# Patient Record
Sex: Male | Born: 1965 | Race: White | Hispanic: No | Smoking: Never smoker
Health system: Southern US, Community
[De-identification: ages and names within clinical notes are randomized; demographics above are authoritative.]

## PROBLEM LIST (undated history)

## (undated) DIAGNOSIS — R12 Heartburn: Secondary | ICD-10-CM

## (undated) DIAGNOSIS — N433 Hydrocele, unspecified: Secondary | ICD-10-CM

## (undated) DIAGNOSIS — Z973 Presence of spectacles and contact lenses: Secondary | ICD-10-CM

## (undated) HISTORY — PX: TYMPANOSTOMY TUBE PLACEMENT: SHX32

## (undated) HISTORY — PX: KNEE ARTHROSCOPY W/ MENISCAL REPAIR: SHX1877

---

## 2000-09-09 ENCOUNTER — Ambulatory Visit (HOSPITAL_COMMUNITY): Admission: RE | Admit: 2000-09-09 | Discharge: 2000-09-09 | Payer: Self-pay | Admitting: General Surgery

## 2000-09-09 HISTORY — PX: INGUINAL HERNIA REPAIR: SUR1180

## 2004-02-02 ENCOUNTER — Emergency Department (HOSPITAL_COMMUNITY): Admission: EM | Admit: 2004-02-02 | Discharge: 2004-02-02 | Payer: Self-pay | Admitting: Emergency Medicine

## 2008-10-27 ENCOUNTER — Emergency Department (HOSPITAL_COMMUNITY): Admission: EM | Admit: 2008-10-27 | Discharge: 2008-10-27 | Payer: Self-pay | Admitting: Emergency Medicine

## 2010-12-06 LAB — COMPREHENSIVE METABOLIC PANEL
ALT: 26 U/L (ref 0–53)
AST: 28 U/L (ref 0–37)
Albumin: 4.6 g/dL (ref 3.5–5.2)
Alkaline Phosphatase: 71 U/L (ref 39–117)
Potassium: 4 mEq/L (ref 3.5–5.1)
Sodium: 137 mEq/L (ref 135–145)
Total Protein: 7.5 g/dL (ref 6.0–8.3)

## 2011-01-11 NOTE — Op Note (Signed)
[place]. [facility]  Patient:    Dennis Cohen, Dennis Cohen                       MRN: [id number] Adm. Date:  [id number] Attending:  Tempie Donning                           Operative Report  PREOPERATIVE DIAGNOSIS:  Left inguinal hernia.  POSTOPERATIVE DIAGNOSIS:  Left inguinal hernia, large indirect.  PROCEDURE:  Repair left inguinal hernia with underlay preperitoneal and onlay patch Prolene mesh.  SURGEON:  Gita Kudo, M.D.  ANESTHESIA:  General.  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  OPERATIVE FINDINGS:  The patient had a large left inguinal hernia that was indirect.  He had a lipoma of the cord.  Although the floor was examined and was weak, he did not have a true direct hernia.  The nerves and cord structures were identified and not injured.  DESCRIPTION OF PROCEDURE:  Under satisfactory general endotracheal anesthesia, having received 1 g Ancef preoperatively, the patients abdomen and genitalia were prepped and draped in the standard fashion.  A slightly oblique left lower abdominal incision was made and carried down to and through the external oblique and external ring.  Bleeders were coagulated and/or tied with 3-0 Vicryl.  The cord and the contents were mobilized with the Penrose drain and the lipoma dissected high, ligated with 3-0 Vicryl, and excised.  Likewise, the indirect hernia sac was identified, dissected high, and contents easily reduced.  It was twisted and secured at the neck with 0 Prolene suture ligature and left long after cutting and the sac excised.  The floor of the canal was then opened from the pubis to the internal ring and finger dissection used to develop the preperitoneal space to allow placement of the mesh underlay.  The contents were held away with a gauze, and a piece of 4 x 6 inch Prolene mesh was cut into one-third, two-third, and the  smaller increment over and anchored to the preperitoneal space with a suture in the Coopers ligament.  It was then unfolded inferiorly and laterally and then the gauze removed from the preperitoneal space and the mesh unfolded medially and superiorly.  It lay in good position.  The floor was then closed over this with a running suture of 0 Prolene suture, taking intermittent bites of the mesh to hold it in good position.  At the internal ring, the suture was tied and the ends of the suture left long.  This ring was made snug.  The remainder of the mesh was then tailored into an oval to sit in the floor and a slit made to go around the cord structures.  It was anchored at the internal ring with the previous suture and then tacked around the periphery under slight tension to the internal oblique above and the inguinal ligament below.  The tails were sutured to each other above and lateral to the cord and anchored to the fascia there also.  Then the wound was lavaged with saline and infiltrated with Marcaine.  It was closed in layers with running 2-0 Vicryl for external oblique, interrupted 2-0 Vicryl for deep fascia, 3-0 Vicryl for subcutaneous tissue, Steri-Strips approximated the skin.  Sterile absorbent dressing applied, and the patient went to the recovery room from the operating room without complication. DD:  09/09/00 TD:  09/09/00 Job: [id number]

## 2013-08-13 ENCOUNTER — Telehealth: Payer: Self-pay | Admitting: Radiology

## 2013-08-13 ENCOUNTER — Ambulatory Visit (INDEPENDENT_AMBULATORY_CARE_PROVIDER_SITE_OTHER): Payer: BC Managed Care – PPO | Admitting: Emergency Medicine

## 2013-08-13 VITALS — BP 130/80 | HR 68 | Temp 99.1°F | Resp 16 | Ht 67.0 in | Wt 189.0 lb

## 2013-08-13 DIAGNOSIS — N508 Other specified disorders of male genital organs: Secondary | ICD-10-CM

## 2013-08-13 DIAGNOSIS — N5089 Other specified disorders of the male genital organs: Secondary | ICD-10-CM

## 2013-08-13 DIAGNOSIS — N50812 Left testicular pain: Secondary | ICD-10-CM

## 2013-08-13 DIAGNOSIS — N509 Disorder of male genital organs, unspecified: Secondary | ICD-10-CM

## 2013-08-13 LAB — POCT URINALYSIS DIPSTICK
Bilirubin, UA: NEGATIVE
Blood, UA: NEGATIVE
Nitrite, UA: NEGATIVE
pH, UA: 7.5

## 2013-08-13 LAB — POCT CBC
Hemoglobin: 14.6 g/dL (ref 14.1–18.1)
Lymph, poc: 2 (ref 0.6–3.4)
MCH, POC: 30.4 pg (ref 27–31.2)
MCHC: 32.2 g/dL (ref 31.8–35.4)
MID (cbc): 0.3 (ref 0–0.9)
MPV: 8 fL (ref 0–99.8)
POC MID %: 5.2 %M (ref 0–12)
WBC: 6.1 10*3/uL (ref 4.6–10.2)

## 2013-08-13 NOTE — Telephone Encounter (Signed)
To you FYI  

## 2013-08-13 NOTE — Patient Instructions (Addendum)
You can go for the [place] Monday at 3pm at [facility] [address] [place]

## 2013-08-13 NOTE — Telephone Encounter (Signed)
[place] scheduled for Monday 3 pm

## 2013-08-13 NOTE — Progress Notes (Signed)
   Subjective:    Patient ID: Dennis Cohen, male    DOB: Sep 13, 1965, 47 y.o.   MRN: [id number]  HPI patient states for the last 3-4 weeks and possibly longer he has noticed swelling of his left testicle. He states he felt like he had a recurrence of his hernia. He had surgery for hernia about 10 years ago.    Review of Systems he denies any GI symptoms of nausea vomiting he has had no change in bowel habits .    Objective:   Physical Exam the patient is not acutely ill there is a baseball size mass involving the left testicle which does transilluminate I did not feel a distinct hernia  Results for orders placed in visit on 08/13/13  POCT CBC      Result Value Range   WBC 6.1  4.6 - 10.2 K/uL   Lymph, poc 2.0  0.6 - 3.4   POC LYMPH PERCENT 32.5  10 - 50 %L   MID (cbc) 0.3  0 - 0.9   POC MID % 5.2  0 - 12 %M   POC Granulocyte 3.8  2 - 6.9   Granulocyte percent 62.3  37 - 80 %G   RBC 4.81  4.69 - 6.13 M/uL   Hemoglobin 14.6  14.1 - 18.1 g/dL   HCT, POC 91.4  78.2 - 53.7 %   MCV 94.2  80 - 97 fL   MCH, POC 30.4  27 - 31.2 pg   MCHC 32.2  31.8 - 35.4 g/dL   RDW, POC 95.6     Platelet Count, POC 272  142 - 424 K/uL   MPV 8.0  0 - 99.8 fL  POCT URINALYSIS DIPSTICK      Result Value Range   Color, UA yellow     Clarity, UA clear     Glucose, UA neg     Bilirubin, UA neg     Ketones, UA neg     Spec Grav, UA 1.020     Blood, UA neg     pH, UA 7.5     Protein, UA neg     Urobilinogen, UA 0.2     Nitrite, UA neg     Leukocytes, UA Negative          Assessment & Plan:  We'll go ahead and do a CBC and a urine because he had a temperature of 99 1. I've scheduled him for an ultrasound of the left testicle along with referral to the urologist.

## 2013-08-16 ENCOUNTER — Other Ambulatory Visit: Payer: Self-pay

## 2013-08-17 ENCOUNTER — Ambulatory Visit
Admission: RE | Admit: 2013-08-17 | Discharge: 2013-08-17 | Disposition: A | Discharge status: Home / Self Care | Payer: BC Managed Care – PPO | Source: Ambulatory Visit | Attending: Emergency Medicine | Admitting: Emergency Medicine

## 2013-08-17 DIAGNOSIS — N5089 Other specified disorders of the male genital organs: Secondary | ICD-10-CM

## 2013-08-17 DIAGNOSIS — N50812 Left testicular pain: Secondary | ICD-10-CM

## 2013-09-29 ENCOUNTER — Other Ambulatory Visit: Payer: Self-pay | Admitting: Urology

## 2013-10-01 ENCOUNTER — Encounter (HOSPITAL_BASED_OUTPATIENT_CLINIC_OR_DEPARTMENT_OTHER): Payer: Self-pay | Admitting: *Deleted

## 2013-10-04 ENCOUNTER — Encounter (HOSPITAL_BASED_OUTPATIENT_CLINIC_OR_DEPARTMENT_OTHER): Payer: Self-pay | Admitting: *Deleted

## 2013-10-04 NOTE — Progress Notes (Signed)
NPO AFTER MN.  ARRIVE AT 0830.  NEEDS HG. 

## 2013-10-11 ENCOUNTER — Ambulatory Visit (HOSPITAL_BASED_OUTPATIENT_CLINIC_OR_DEPARTMENT_OTHER): Payer: BC Managed Care – PPO | Admitting: Anesthesiology

## 2013-10-11 ENCOUNTER — Encounter (HOSPITAL_BASED_OUTPATIENT_CLINIC_OR_DEPARTMENT_OTHER): Payer: Self-pay | Admitting: *Deleted

## 2013-10-11 ENCOUNTER — Encounter (HOSPITAL_BASED_OUTPATIENT_CLINIC_OR_DEPARTMENT_OTHER)
Admission: RE | Disposition: A | Discharge status: Home / Self Care | Payer: Self-pay | Source: Ambulatory Visit | Attending: Urology

## 2013-10-11 ENCOUNTER — Ambulatory Visit (HOSPITAL_BASED_OUTPATIENT_CLINIC_OR_DEPARTMENT_OTHER)
Admission: RE | Admit: 2013-10-11 | Discharge: 2013-10-11 | Disposition: A | Discharge status: Home / Self Care | Payer: BC Managed Care – PPO | Source: Ambulatory Visit | Attending: Urology | Admitting: Urology

## 2013-10-11 ENCOUNTER — Encounter (HOSPITAL_BASED_OUTPATIENT_CLINIC_OR_DEPARTMENT_OTHER): Payer: BC Managed Care – PPO | Admitting: Anesthesiology

## 2013-10-11 DIAGNOSIS — N433 Hydrocele, unspecified: Secondary | ICD-10-CM

## 2013-10-11 HISTORY — DX: Presence of spectacles and contact lenses: Z97.3

## 2013-10-11 HISTORY — DX: Hydrocele, unspecified: N43.3

## 2013-10-11 HISTORY — PX: HYDROCELE EXCISION: SHX482

## 2013-10-11 LAB — POCT HEMOGLOBIN-HEMACUE: Hemoglobin: 14.8 g/dL (ref 13.0–17.0)

## 2013-10-11 SURGERY — HYDROCELECTOMY
Anesthesia: General | Site: Scrotum | Laterality: Left

## 2013-10-11 MED ORDER — MIDAZOLAM HCL 2 MG/2ML IJ SOLN
INTRAMUSCULAR | Status: AC
  Filled 2013-10-11: qty 2

## 2013-10-11 MED ORDER — PROPOFOL 10 MG/ML IV BOLUS
INTRAVENOUS | Status: DC | PRN
  Administered 2013-10-11: 200 mg via INTRAVENOUS

## 2013-10-11 MED ORDER — OXYCODONE-ACETAMINOPHEN 10-325 MG PO TABS: 1.0000 | ORAL_TABLET | ORAL | Status: DC | PRN

## 2013-10-11 MED ORDER — DEXAMETHASONE SODIUM PHOSPHATE 4 MG/ML IJ SOLN
INTRAMUSCULAR | Status: DC | PRN
  Administered 2013-10-11: 10 mg via INTRAVENOUS

## 2013-10-11 MED ORDER — ONDANSETRON HCL 4 MG/2ML IJ SOLN
INTRAMUSCULAR | Status: DC | PRN
  Administered 2013-10-11: 4 mg via INTRAVENOUS

## 2013-10-11 MED ORDER — BUPIVACAINE-EPINEPHRINE 0.5% -1:200000 IJ SOLN
INTRAMUSCULAR | Status: DC | PRN
  Administered 2013-10-11: 8 mL

## 2013-10-11 MED ORDER — OXYCODONE-ACETAMINOPHEN 5-325 MG PO TABS
1.0000 | ORAL_TABLET | ORAL | Status: DC | PRN
  Administered 2013-10-11: 1 via ORAL
  Filled 2013-10-11: qty 2

## 2013-10-11 MED ORDER — FENTANYL CITRATE 0.05 MG/ML IJ SOLN
INTRAMUSCULAR | Status: DC | PRN
  Administered 2013-10-11: 50 ug via INTRAVENOUS
  Administered 2013-10-11 (×3): 25 ug via INTRAVENOUS

## 2013-10-11 MED ORDER — FENTANYL CITRATE 0.05 MG/ML IJ SOLN
INTRAMUSCULAR | Status: AC
  Filled 2013-10-11: qty 6

## 2013-10-11 MED ORDER — OXYCODONE-ACETAMINOPHEN 5-325 MG PO TABS
ORAL_TABLET | ORAL | Status: AC
  Filled 2013-10-11: qty 1

## 2013-10-11 MED ORDER — LACTATED RINGERS IV SOLN
INTRAVENOUS | Status: DC
  Administered 2013-10-11: 09:00:00 via INTRAVENOUS
  Filled 2013-10-11: qty 1000

## 2013-10-11 MED ORDER — FENTANYL CITRATE 0.05 MG/ML IJ SOLN
INTRAMUSCULAR | Status: AC
  Filled 2013-10-11: qty 2

## 2013-10-11 MED ORDER — LIDOCAINE HCL (CARDIAC) 20 MG/ML IV SOLN
INTRAVENOUS | Status: DC | PRN
  Administered 2013-10-11: 60 mg via INTRAVENOUS

## 2013-10-11 MED ORDER — FENTANYL CITRATE 0.05 MG/ML IJ SOLN
25.0000 ug | INTRAMUSCULAR | Status: DC | PRN
  Administered 2013-10-11 (×2): 50 ug via INTRAVENOUS
  Filled 2013-10-11: qty 1

## 2013-10-11 MED ORDER — CEFAZOLIN SODIUM-DEXTROSE 2-3 GM-% IV SOLR
2.0000 g | INTRAVENOUS | Status: AC
Start: 2013-10-11 — End: 2013-10-11
  Administered 2013-10-11: 2 g via INTRAVENOUS
  Filled 2013-10-11: qty 50

## 2013-10-11 MED ORDER — LACTATED RINGERS IV SOLN
INTRAVENOUS | Status: DC
  Administered 2013-10-11: 11:00:00 via INTRAVENOUS
  Filled 2013-10-11: qty 1000

## 2013-10-11 MED ORDER — MIDAZOLAM HCL 5 MG/5ML IJ SOLN
INTRAMUSCULAR | Status: DC | PRN
  Administered 2013-10-11: 2 mg via INTRAVENOUS

## 2013-10-11 SURGICAL SUPPLY — 42 items
BLADE SURG 15 STRL LF DISP TIS (BLADE) ×1 IMPLANT
BLADE SURG 15 STRL SS (BLADE) ×1
BLADE SURG ROTATE 9660 (MISCELLANEOUS) IMPLANT
BNDG GAUZE ELAST 4 BULKY (GAUZE/BANDAGES/DRESSINGS) ×2 IMPLANT
CANISTER SUCTION 1200CC (MISCELLANEOUS) IMPLANT
CANISTER SUCTION 2500CC (MISCELLANEOUS) IMPLANT
CLEANER CAUTERY TIP 5X5 PAD (MISCELLANEOUS) IMPLANT
CLOTH BEACON ORANGE TIMEOUT ST (SAFETY) ×2 IMPLANT
COVER MAYO STAND STRL (DRAPES) ×2 IMPLANT
COVER TABLE BACK 60X90 (DRAPES) ×2 IMPLANT
DISSECTOR ROUND CHERRY 3/8 STR (MISCELLANEOUS) IMPLANT
DRAIN PENROSE 18X1/4 LTX STRL (WOUND CARE) IMPLANT
DRAPE PED LAPAROTOMY (DRAPES) ×2 IMPLANT
ELECT REM PT RETURN 9FT ADLT (ELECTROSURGICAL)
ELECTRODE REM PT RTRN 9FT ADLT (ELECTROSURGICAL) IMPLANT
GLOVE BIO SURGEON STRL SZ8 (GLOVE) ×4 IMPLANT
GLOVE BIOGEL PI IND STRL 7.5 (GLOVE) ×2 IMPLANT
GLOVE BIOGEL PI INDICATOR 7.5 (GLOVE) ×2
GLOVE SURG SS PI 7.5 STRL IVOR (GLOVE) ×2 IMPLANT
GOWN PREVENTION PLUS LG XLONG (DISPOSABLE) IMPLANT
GOWN STRL REIN XL XLG (GOWN DISPOSABLE) IMPLANT
GOWN STRL REUS W/TWL XL LVL3 (GOWN DISPOSABLE) ×4 IMPLANT
NEEDLE HYPO 25X1 1.5 SAFETY (NEEDLE) IMPLANT
NS IRRIG 500ML POUR BTL (IV SOLUTION) ×2 IMPLANT
PACK BASIN DAY SURGERY FS (CUSTOM PROCEDURE TRAY) ×2 IMPLANT
PAD CLEANER CAUTERY TIP 5X5 (MISCELLANEOUS)
PENCIL BUTTON HOLSTER BLD 10FT (ELECTRODE) ×2 IMPLANT
SPONGE GAUZE 4X4 12PLY STER LF (GAUZE/BANDAGES/DRESSINGS) ×2 IMPLANT
SUPPORT SCROTAL LG STRP (MISCELLANEOUS) ×2 IMPLANT
SUT CHROMIC 3 0 SH 27 (SUTURE) ×6 IMPLANT
SUT ETHILON 3 0 PS 1 (SUTURE) IMPLANT
SUT SILK 4 0 TIES 17X18 (SUTURE) IMPLANT
SUT VIC AB 4-0 RB1 27 (SUTURE)
SUT VIC AB 4-0 RB1 27X BRD (SUTURE) IMPLANT
SUT VICRYL 6 0 RB 1 (SUTURE) IMPLANT
SYR CONTROL 10ML LL (SYRINGE) IMPLANT
TOWEL OR 17X24 6PK STRL BLUE (TOWEL DISPOSABLE) ×4 IMPLANT
TRAY DSU PREP LF (CUSTOM PROCEDURE TRAY) ×2 IMPLANT
TUBE CONNECTING 12X1/4 (SUCTIONS) ×2 IMPLANT
WATER STERILE IRR 3000ML UROMA (IV SOLUTION) IMPLANT
WATER STERILE IRR 500ML POUR (IV SOLUTION) ×2 IMPLANT
YANKAUER SUCT BULB TIP NO VENT (SUCTIONS) ×2 IMPLANT

## 2013-10-11 NOTE — Discharge Instructions (Signed)
Scrotal surgery postoperative instructions ° °Wound: ° °In most cases your incision will have absorbable sutures that will dissolve within the first 10-20 days. Some will fall out even earlier. Expect some redness as the sutures dissolved but this should occur only around the sutures. If there is generalized redness, especially with increasing pain or swelling, let us know. The scrotum will very likely get "black and blue" as the blood in the tissues spread. Sometimes the whole scrotum will turn colors. The black and blue is followed by a yellow and brown color. In time, all the discoloration will go away. In some cases some firm swelling in the area of the testicle may persist for up to 4-6 weeks after the surgery and is considered normal in most cases. ° °Diet: ° °You may return to your normal diet within 24 hours following your surgery. You may note some mild nausea and possibly vomiting the first 6-8 hours following surgery. This is usually due to the side effects of anesthesia, and will disappear quite soon. I would suggest clear liquids and a very light meal the first evening following your surgery. ° °Activity: ° °Your physical activity should be restricted the first 48 hours. During that time you should remain relatively inactive, moving about only when necessary. During the first 7-10 days following surgery he should avoid lifting any heavy objects (anything greater than 15 pounds), and avoid strenuous exercise. If you work, ask us specifically about your restrictions, both for work and home. We will write a note to your employer if needed. ° °You should plan to wear a tight pair of jockey shorts or an athletic supporter for the first 4-5 days, even to sleep. This will keep the scrotum immobilized to some degree and keep the swelling down. ° °Ice packs should be placed on and off over the scrotum for the first 48 hours. Frozen peas or corn in a ZipLock bag can be frozen, used and re-frozen. Fifteen minutes  on and 15 minutes off is a reasonable schedule. The ice is a good pain reliever and keeps the swelling down. ° °Hygiene: ° °You may shower 48 hours after your surgery. Tub bathing should be restricted until the seventh day. ° ° ° ° ° ° ° ° ° °Medication: ° °You will be sent home with some type of pain medication. In many cases you will be sent home with a narcotic pain pill (Vicodin or Tylox). If the pain is not too bad, you may take either Tylenol (acetaminophen) or Advil (ibuprofen) which contain no narcotic agents, and might be tolerated a little better, with fewer side effects. If the pain medication you are sent home with does not control the pain, you will have to let us know. Some narcotic pain medications cannot be given or refilled by a phone call to a pharmacy. ° °Problems you should report to us: ° °· Fever of 101.0 degrees Fahrenheit or greater. °· Moderate or severe swelling under the skin incision or involving the scrotum. °· Drug reaction such as hives, a rash, nausea or vomiting ° ° °Post Anesthesia Home Care Instructions ° °Activity: °Get plenty of rest for the remainder of the day. A responsible adult should stay with you for 24 hours following the procedure.  °For the next 24 hours, DO NOT: °-Drive a car °-Operate machinery °-Drink alcoholic beverages °-Take any medication unless instructed by your physician °-Make any legal decisions or sign important papers. ° °Meals: °Start with liquid foods such as gelatin or   regular foods as tolerated. Avoid greasy, spicy, heavy foods. If nausea and/or vomiting occur, drink only clear liquids until the nausea and/or vomiting subsides. Call your physician if vomiting continues.  Special Instructions/Symptoms: Your throat may feel dry or sore from the anesthesia or the breathing tube placed in your throat during surgery. If this causes discomfort, gargle with warm salt water. The discomfort should disappear within 24 hours.

## 2013-10-11 NOTE — Anesthesia Preprocedure Evaluation (Addendum)
Anesthesia Evaluation  Patient identified by MRN, date of birth, ID band Patient awake    Reviewed: Allergy & Precautions, H&P , NPO status , Patient's Chart, lab work & pertinent test results  Airway Mallampati: II  TM Distance: >3 FB Neck ROM: full    Dental no notable dental hx. (+) Teeth Intact, Dental Advisory Given   Pulmonary neg pulmonary ROS,  breath sounds clear to auscultation  Pulmonary exam normal       Cardiovascular Exercise Tolerance: Good negative cardio ROS  Rhythm:regular Rate:Normal     Neuro/Psych negative neurological ROS  negative psych ROS   GI/Hepatic negative GI ROS, Neg liver ROS,   Endo/Other  negative endocrine ROS  Renal/GU negative Renal ROS  negative genitourinary   Musculoskeletal   Abdominal   Peds  Hematology negative hematology ROS (+)   Anesthesia Other Findings   Reproductive/Obstetrics negative OB ROS                             Anesthesia Physical Anesthesia Plan  ASA: I  Anesthesia Plan: General   Post-op Pain Management:    Induction: Intravenous  Airway Management Planned: LMA  Additional Equipment:   Intra-op Plan:   Post-operative Plan:   Informed Consent: I have reviewed the patients History and Physical, chart, labs and discussed the procedure including the risks, benefits and alternatives for the proposed anesthesia with the patient or authorized representative who has indicated his/her understanding and acceptance.   Dental Advisory Given  Plan Discussed with: CRNA and Surgeon  Anesthesia Plan Comments:         Anesthesia Quick Evaluation  

## 2013-10-11 NOTE — Op Note (Signed)
PATIENT:  Dennis Cohen  PRE-OPERATIVE DIAGNOSIS: Left hydrocele  POST-OPERATIVE DIAGNOSIS:  Same  PROCEDURE:  Procedure(s): Left hydrocelectomy  SURGEON:  Claybon Jabs  INDICATION: Dennis Cohen is a 48 year old male who developed left hemiscrotal swelling in 12/14. It no prior history of epididymitis or testicular inflammation. The swelling had not increased in size over time however it was uncomfortable and was getting away. A scrotal ultrasound was performed that revealed this to be a simple hydrocele. We discussed the treatment options and elected to proceed with surgical correction.  ANESTHESIA:  General  EBL:  Minimal  DRAINS: None  LOCAL MEDICATIONS USED:  Marcaine with epinephrine  SPECIMEN:  None  DISPOSITION OF SPECIMEN:  N/A  Description of procedure: After informed consent the patient was brought to the major OR, placed on the table and administered general anesthesia. His genitalia was then sterilely prepped and draped. An official timeout was then performed.  A midline median raphae scrotal incision was then made and carried down over the left hydrocele. The tissue over the hydrocele was cleared using a combination of sharp and blunt technique. The hydrocele was then opened, drained of clear amber fluid and delivered through the incision. The excess hydrocele sac tissue was excised with the Bovie electrocautery and then I cauterized the edges. I then reapproximated the edges posteriorly behind the epididymis with a running, locking 3-0 chromic suture. The appendix testis was removed with the Bovie. I noted that the hydrocele sac was somewhat thickened and appeared mildly inflamed. This appeared to be consistent with an inflammatory cause for his hydrocele formation. There is nothing to suggest infection present at the time of surgery.  His testicle was then replaced in the normal anatomic position and his left hemiscrotum. I then closed the deep scrotal tissue with running  3-0 chromic suture in a locking fashion. I injected quarter percent Marcaine with epinephrine in the subcutaneous tissue and closed the skin with running 3-0 chromic. Neosporin, a sterile gauze dressing, fluff Kerlix and a scrotal support were applied. The patient tolerated the procedure well no intraoperative complications. Needle sponge and instrument counts were correct at the end of the operation.   PLAN OF CARE: Discharge to home after PACU  PATIENT DISPOSITION:  PACU - hemodynamically stable.

## 2013-10-11 NOTE — Transfer of Care (Signed)
Immediate Anesthesia Transfer of Care Note  Patient: Dennis Cohen  Procedure(s) Performed: Procedure(s) (LRB): LEFT HYDROCELECTOMY ADULT (Left)  Patient Location: PACU  Anesthesia Type: General  Level of Consciousness: awake, oriented, sedated and patient cooperative  Airway & Oxygen Therapy: Patient Spontanous Breathing and Patient connected to face mask oxygen  Post-op Assessment: Report given to PACU RN and Post -op Vital signs reviewed and stable  Post vital signs: Reviewed and stable  Complications: No apparent anesthesia complications

## 2013-10-11 NOTE — Anesthesia Postprocedure Evaluation (Signed)
  Anesthesia Post-op Note  Patient: Dennis Cohen  Procedure(s) Performed: Procedure(s) (LRB): LEFT HYDROCELECTOMY ADULT (Left)  Patient Location: PACU  Anesthesia Type: General  Level of Consciousness: awake and alert   Airway and Oxygen Therapy: Patient Spontanous Breathing  Post-op Pain: mild  Post-op Assessment: Post-op Vital signs reviewed, Patient's Cardiovascular Status Stable, Respiratory Function Stable, Patent Airway and No signs of Nausea or vomiting  Last Vitals:  Filed Vitals:   10/11/13 1130  BP: 111/78  Pulse: 67  Temp:   Resp: 14    Post-op Vital Signs: stable   Complications: No apparent anesthesia complications

## 2013-10-11 NOTE — Anesthesia Procedure Notes (Signed)
Procedure Name: LMA Insertion Date/Time: 10/11/2013 10:05 AM Performed by: Denna Haggard D Pre-anesthesia Checklist: Patient identified, Emergency Drugs available, Suction available and Patient being monitored Patient Re-evaluated:Patient Re-evaluated prior to inductionOxygen Delivery Method: Circle System Utilized Preoxygenation: Pre-oxygenation with 100% oxygen Intubation Type: IV induction Ventilation: Mask ventilation without difficulty LMA: LMA inserted LMA Size: 4.0 Number of attempts: 1 Airway Equipment and Method: bite block Placement Confirmation: positive ETCO2 Tube secured with: Tape Dental Injury: Teeth and Oropharynx as per pre-operative assessment

## 2013-10-11 NOTE — H&P (Signed)
Dennis Cohen is a 48 year old male patient of Dr. Everlene Farrier who was seen in office consultation for further evaluation of left hemiscrotal swelling and discomfort.   History of Present Illness The patient was seen on 08/13/13 with complaints of a one-month history of left hemiscrotal swelling. Examination at that time revealed an enlarged left hemiscrotum which transilluminated. He was found have a normal white count.    Interval history: He reports that for over a month now he has had left hemiscrotal swelling. 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 He also has noted swelling. He has not had any symptoms to suggest UTI and has no prior history of epididymitis. He has no voiding symptoms currently and indicates that it is not increased in size significantly but is uncomfortable and does get in the way. He also told me that he underwent a scrotal ultrasound and it was found to be a hydrocele. The scrotal swelling is of moderate severity.  Past Medical History Problems  1. History of No significant past medical history  Surgical History Problems  1. History of Arthroscopy Knee Right 2. History of Hernia Repair  Current Meds 1. Ibuprofen TABS;  Therapy: (Recorded:26Jan2015) to Recorded  Allergies Medication  1. No Known Drug Allergies  Family History Problems  1. Family history of myocardial infarction (V17.3) : Father  Social History Problems    Denied: History of Alcohol use   Caffeine use (V49.89)   Divorced   Never smoker (V49.89)  Review of Systems Genitourinary, constitutional, skin, eye, otolaryngeal, hematologic/lymphatic, cardiovascular, pulmonary, endocrine, musculoskeletal, gastrointestinal, neurological and psychiatric system(s) were reviewed and pertinent findings if present are noted.  Genitourinary: testicular mass.    Vitals Vital Signs  Height: 5 ft 7 in Weight:  189 lb  BMI Calculated: 29.6 BSA Calculated: 1.97 Blood Pressure: 151 / 89 Temperature: 97.6 F Heart Rate: 96  Physical Exam Constitutional: Well nourished and well developed . No acute distress.   ENT:. The ears and nose are normal in appearance.   Neck: The appearance of the neck is normal and no neck mass is present.   Pulmonary: No respiratory distress and normal respiratory rhythm and effort.   Cardiovascular: Heart rate and rhythm are normal . No peripheral edema.   Abdomen: The abdomen is soft and nontender. No masses are palpated. No CVA tenderness. No hernias are palpable. No hepatosplenomegaly noted.   Genitourinary: Examination of the penis demonstrates no discharge, no masses, no lesions and a normal meatus. The penis is circumcised. The scrotum is without lesions. Examination of the left scrotum demostrates a hydrocele. The right epididymis is palpably normal and non-tender. The right testis is non-tender and without masses. The left testis is nonpalpable.   Lymphatics: The femoral and inguinal nodes are not enlarged or tender.   Skin: Normal skin turgor, no visible rash and no visible skin lesions.   Neuro/Psych:. Mood and affect are appropriate.    Results/Data Urine  COLOR YELLOW  APPEARANCE CLEAR  SPECIFIC GRAVITY 1.025  pH 5.5  GLUCOSE NEG mg/dL BILIRUBIN NEG  KETONE NEG mg/dL BLOOD TRACE  PROTEIN NEG mg/dL UROBILINOGEN 0.2 mg/dL NITRITE NEG  LEUKOCYTE ESTERASE NEG  SQUAMOUS EPITHELIAL/HPF RARE  WBC 0-2 WBC/hpf RBC NONE SEEN RBC/hpf BACTERIA NONE SEEN  CRYSTALS NONE SEEN  CASTS NONE SEEN   Old records or history reviewed: Notes from Dr. Perfecto Kingdom office as above.  The  following clinical lab reports were reviewed:  UA.    Assessment   He has a fairly significant, tense left hydrocele. I transilluminated the lesion as I was not supplied with the ultrasound results and it clearly is a hydrocele. We discussed the treatment options since he is  symptomatic. He said he been on the Internet and heard that it could be aspirated. We discussed the fact that there is a high probability of recurrence. This form of therapy and therefore I discussed hydrocelectomy as a treatment option. We went over the incision used, risks and complications associated with the procedure, the probability of success and the outpatient nature of the procedures well as the anticipated postoperative course. He understands and has elected to proceed.   Plan  He is scheduled for an outpatient left hydrocelectomy.

## 2013-10-13 ENCOUNTER — Encounter (HOSPITAL_BASED_OUTPATIENT_CLINIC_OR_DEPARTMENT_OTHER): Payer: Self-pay | Admitting: Urology

## 2013-11-13 ENCOUNTER — Emergency Department (HOSPITAL_COMMUNITY)
Admission: EM | Admit: 2013-11-13 | Discharge: 2013-11-14 | Disposition: A | Discharge status: Home / Self Care | Payer: BC Managed Care – PPO | Attending: Emergency Medicine | Admitting: Emergency Medicine

## 2013-11-13 ENCOUNTER — Encounter (HOSPITAL_COMMUNITY): Payer: Self-pay | Admitting: Emergency Medicine

## 2013-11-13 DIAGNOSIS — S63279A Dislocation of unspecified interphalangeal joint of unspecified finger, initial encounter: Secondary | ICD-10-CM | POA: Insufficient documentation

## 2013-11-13 DIAGNOSIS — S63259A Unspecified dislocation of unspecified finger, initial encounter: Secondary | ICD-10-CM

## 2013-11-13 DIAGNOSIS — Y9239 Other specified sports and athletic area as the place of occurrence of the external cause: Secondary | ICD-10-CM | POA: Insufficient documentation

## 2013-11-13 DIAGNOSIS — W219XXA Striking against or struck by unspecified sports equipment, initial encounter: Secondary | ICD-10-CM | POA: Insufficient documentation

## 2013-11-13 DIAGNOSIS — Y92838 Other recreation area as the place of occurrence of the external cause: Secondary | ICD-10-CM

## 2013-11-13 DIAGNOSIS — Y9364 Activity, baseball: Secondary | ICD-10-CM | POA: Insufficient documentation

## 2013-11-13 MED ORDER — ACETAMINOPHEN 500 MG PO TABS
1000.0000 mg | ORAL_TABLET | Freq: Once | ORAL | Status: AC
  Administered 2013-11-14: 1000 mg via ORAL
  Filled 2013-11-13: qty 2

## 2013-11-13 NOTE — ED Provider Notes (Signed)
CSN: [id number]     Arrival date & time 11/13/13  2301 History   First MD Initiated Contact with Patient 11/13/13 2310     This chart was scribed for non-physician practitioner, Cleatrice Burke PA-C, working with Richarda Blade, MD by Forrestine Him, ED Scribe. This patient was seen in room TR08C/TR08C and the patient's care was started at 11:36 PM.   Chief Complaint  Patient presents with  . Finger Injury   The history is provided by the patient. No language interpreter was used.    HPI Comments: HARLES Cohen is a 48 y.o. male who presents to the Emergency Department complaining of a finger injury to the right 5th digit that occurred just prior to arrival. He now c/o of constant pain to the right 5th finger that is progressively worsening. He describes his pain as throbbing, and rates it 10/10. He also reports associated swelling and numbness. Pt states he was hit in the finger with a softball going extremely fast. He has not tried anything OTC for his current discomfort. At this time he denies any fever or chills. Pt has no pertinent medical history. No other concerns this visit.  Past Medical History  Diagnosis Date  . Left hydrocele   . Wears glasses    Past Surgical History  Procedure Laterality Date  . Inguinal hernia repair Left 09-09-2000  . Knee arthroscopy w/ meniscal repair Bilateral LAST ONE  DEC 2001 (RIGHT)  . Tympanostomy tube placement Bilateral AS CHILD  . Hydrocele excision Left 10/11/2013    Procedure: LEFT HYDROCELECTOMY ADULT;  Surgeon: Claybon Jabs, MD;  Location: [facility];  Service: Urology;  Laterality: Left;   Family History  Problem Relation Age of Onset  . Hypertension Mother   . High Cholesterol Father   . Heart attack Father   . Healthy Sister   . Healthy Brother    History  Substance Use Topics  . Smoking status: Never Smoker   . Smokeless tobacco: Never Used  . Alcohol Use: No    Review of Systems  Constitutional: Negative  for fever and chills.  HENT: Negative for congestion.   Eyes: Negative for redness.  Respiratory: Negative for cough.   Gastrointestinal: Negative for abdominal pain.  Musculoskeletal: Positive for arthralgias and joint swelling.  Skin: Negative for rash.  Psychiatric/Behavioral: Negative for confusion.  All other systems reviewed and are negative.      Allergies  Review of patient's allergies indicates no known allergies.  Home Medications   Current Outpatient Rx  Name  Route  Sig  Dispense  Refill  . ibuprofen (ADVIL,MOTRIN) 200 MG tablet   Oral   Take 200 mg by mouth every 6 (six) hours as needed.         Marland Kitchen oxyCODONE-acetaminophen (PERCOCET) 10-325 MG per tablet   Oral   Take 1-2 tablets by mouth every 4 (four) hours as needed for pain. Maximum dose per 24 hours - 8 pills   30 tablet   0     Triage Vitals: BP 128/83  Pulse 91  Temp(Src) 98 F (36.7 C) (Oral)  Resp 16  Ht 5\' 7"  (1.702 m)  Wt 181 lb (82.101 kg)  BMI 28.34 kg/m2  SpO2 97%   Physical Exam  Nursing note and vitals reviewed. Constitutional: He is oriented to person, place, and time. He appears well-developed and well-nourished. No distress.  HENT:  Head: Normocephalic and atraumatic.  Right Ear: External ear normal.  Left Ear: External  ear normal.  Nose: Nose normal.  Eyes: Conjunctivae are normal.  Neck: Normal range of motion. No tracheal deviation present.  Cardiovascular: Normal rate, regular rhythm and normal heart sounds.   Brisk capillary refill  Pulmonary/Chest: Effort normal and breath sounds normal. No stridor.  Abdominal: Soft. He exhibits no distension. There is no tenderness.  Musculoskeletal: Normal range of motion.  Deformity to 5th digit of right hand. Unable to flex/extend  Neurological: He is alert and oriented to person, place, and time.  Sensation intact.   Skin: Skin is warm and dry. He is not diaphoretic.  Psychiatric: He has a normal mood and affect. His behavior is  normal.    ED Course  Reduction of dislocation Performed by: Elwyn Lade Authorized by: Elwyn Lade Consent: Verbal consent obtained. written consent not obtained. The procedure was performed in an emergent situation. Risks and benefits: risks, benefits and alternatives were discussed Consent given by: patient Patient understanding: patient states understanding of the procedure being performed Patient consent: the patient's understanding of the procedure matches consent given Required items: required blood products, implants, devices, and special equipment available Patient identity confirmed: verbally with patient and arm band Time out: Immediately prior to procedure a "time out" was called to verify the correct patient, procedure, equipment, support staff and site/side marked as required. Local anesthesia used: no Patient sedated: no Patient tolerance: Patient tolerated the procedure well with no immediate complications.   (including critical care time)  Patient reassessed after reduction. He is able to flex and extend finger, compartment remains soft. Capillary refill remains brisk. Sensation is intact.   DIAGNOSTIC STUDIES: Oxygen Saturation is 97% on RA, Normal by my interpretation.    COORDINATION OF CARE: 11:40 PM- Will order X-Ray. Discussed treatment plan with pt at bedside and pt agreed to plan.     Labs Review Labs Reviewed - No data to display Imaging Review Dg Finger Little Right  11/14/2013   CLINICAL DATA:  Sports injury  EXAM: RIGHT LITTLE FINGER 2+V  COMPARISON:  None.  FINDINGS: Dislocation of the proximal interphalangeal joint of the fifth digit with dorsal displacement of the proximal phalanx. No evidence of fracture .  IMPRESSION: Dislocation of the fifth proximal interphalangeal joint.   Electronically Signed   By: Suzy Bouchard M.D.   On: 11/14/2013 00:56     EKG Interpretation None      MDM   Final diagnoses:  Finger dislocation     Patient presents to ED with finger dislocation. Finger was successfully reduced without sedation or anesthesia. Patient is able to flex and extend finger after reduction. Sensation intact. Compartment soft. Brisk capillary refill. Splint was placed in ED and patient was instructed to call hand surgery on Monday morning for an appointment. He was instructed to leave the finger splint on until he sees hand. No softball until he is cleared by hand. Return instructions given. Vital signs stable for discharge. Patient / Family / Caregiver informed of clinical course, understand medical decision-making process, and agree with plan.   I personally performed the services described in this documentation, which was scribed in my presence. The recorded information has been reviewed and is accurate.    Elwyn Lade, PA-C 11/14/13 1319

## 2013-11-13 NOTE — ED Notes (Signed)
Pt was hit in the right pinkey finger by a softball.  Swelling noted to finger

## 2013-11-14 ENCOUNTER — Emergency Department (HOSPITAL_COMMUNITY): Payer: BC Managed Care – PPO

## 2013-11-14 NOTE — Discharge Instructions (Signed)
Please call the hand surgeon listed above on Monday morning for an appointment as soon as available. Keep the splint on your finger until that time. Do not play softball until you are cleared by the hand surgeon.   Finger Dislocation Dislocation is an injury to a joint, where the linked bones shift from their normal position and no longer touch each other. Dislocation is common in the fingers. Subluxation is similar, except that the linked bones still touch. This is less common in fingers than dislocation. Bones often break along with dislocations or subluxations. Ligament sprains must occur for these injuries to happen.  SYMPTOMS   Severe pain, at the time of injury.  Pain with movement of the finger.  Loss of function of the joint.  Tenderness, obvious deformity, swelling, and bruising.  Numbness or paralysis below the injury, from pinching, cutting, or pressure on blood vessels or nerves (uncommon). CAUSES   Direct or indirect hit (trauma).  Twisting injury.  Landing on the hand, finger, or thumb.  End result of a severe finger sprain or fracture.  Birth defect (congenital abnormality), such as shallow or malformed joint surface. RISK INCREASES WITH:  Contact sports (baseball, football, basketball, soccer).  Previous finger and hand sprains or dislocations.  Repeated injury to any joint in the hand.  Poor hand strength and flexibility. PREVENTION   Warm up and stretch properly activity.  Maintain proper conditioning, especially hand strength and flexibility.  To prevent recurrence, protect vulnerable joints after healing, with protective devices or tape. PROGNOSIS  With proper treatment, healing may take up to 6 weeks. RELATED COMPLICATIONS   Damage to nearby nerves or major blood vessels.  Finger fracture or injury to joint cartilage.  Excessive bleeding around the injury site.  Recurring dislocations.  Stiffness or loss of motion of the injured  joint.  Unstable or arthritic joint, following repeated injury, surgery, or delayed treatment.  Longer healing time or recurring dislocation, if activity is resumed too soon. TREATMENT  Treatment requires immediate repositioning of the joint (reduction) by a medically trained person. If that does not work, surgery may be needed. After repositioning, treatment involves ice and medicines to reduce pain and inflammation. The joint should be restrained by splinting, casting, or bracing for 2 to 6 weeks. This protects the joint while the ligaments heal. After restraint, stretching and strengthening exercises are advised for the injured and weakened joint and muscles. Exercises may be completed at home or with a therapist. Use of taping may be advised when returning to sports.  MEDICATION   General anesthesia or muscle relaxants may help make joint repositioning possible.  If pain medicine is needed, nonsteroidal anti-inflammatory medicines (aspirin and ibuprofen), or other minor pain relievers (acetaminophen), are often advised.  Do not take pain medicine for 7 days before surgery.  Stronger pain relievers may be prescribed by your caregiver. Use only as directed and only as much as you need. HEAT AND COLD  Cold treatment (icing) relieves pain and reduces inflammation. Cold treatment should be applied for 10 to 15 minutes every 2 to 3 hours, and immediately after activity that aggravates your symptoms. Use ice packs or an ice massage.  Heat treatment may be used before performing the stretching and strengthening activities prescribed by your caregiver, physical therapist, or athletic trainer. Use a heat pack or a warm water soak. SEEK MEDICAL CARE IF:   Pain, tenderness, or swelling gets worse, despite treatment.  You experience pain, numbness, or coldness in the finger.  Blue, gray, or dark color appears in the fingernails.  Any of the following occur after surgery:  Increased pain,  swelling, redness, drainage of fluids, or bleeding in the affected area.  Signs of infection: headache, muscle aches, dizziness, or general ill feeling with fever.  New, unexplained symptoms develop. (Drugs used in treatment may produce side effects.) Document Released: 08/12/2005 Document Revised: 11/04/2011 Document Reviewed: 11/24/2008 [facility] Patient Information 2014 [place], [place].

## 2013-11-15 NOTE — ED Provider Notes (Signed)
Medical screening examination/treatment/procedure(s) were performed by non-physician practitioner and as supervising physician I was immediately available for consultation/collaboration.   EKG Interpretation None       Richarda Blade, MD 11/15/13 2120

## 2016-05-21 ENCOUNTER — Ambulatory Visit (INDEPENDENT_AMBULATORY_CARE_PROVIDER_SITE_OTHER): Payer: BLUE CROSS/BLUE SHIELD | Admitting: Family Medicine

## 2016-05-21 VITALS — BP 154/80 | HR 67 | Temp 98.4°F | Resp 16 | Ht 67.75 in | Wt 187.8 lb

## 2016-05-21 DIAGNOSIS — K4090 Unilateral inguinal hernia, without obstruction or gangrene, not specified as recurrent: Secondary | ICD-10-CM | POA: Diagnosis not present

## 2016-05-21 NOTE — Patient Instructions (Addendum)
Please report to [facility] for follow-up of right inguinal hernia.  Appointment: October 2nd, 2017 @ 4:30 pm and you must arrive at 4:00 pm. [address] [place], [place] [address] Picture ID and recent insurance card.  Restrictions: You should not lift anything greater than 10 lbs until hernia repair is completed.  Red Flags: If scrotal region becomes red, increases more in size, is not reducible when lying flat, Intolerable pain, please return for care or report immediately to the emergency room for evaluation.    IF you received an x-ray today, you will receive an invoice from [facility]. Please contact [facility] Rad[facility] at [phone] with questions or concerns regarding your invoice.   IF you received labwork today, you will receive an invoice from [employer]. Please contact [facility] at [phone] with questions or concerns regarding your invoice.   Our billing staff will not be able to assist you with questions regarding bills from these companies.  You will be contacted with the lab results as soon as they are available. The fastest way to get your results is to activate your My Chart account. Instructions are located on the last page of this paperwork. If you have not heard from [place] regarding the results in 2 weeks, please contact this office.     inguinal Inguinal Hernia, Adult Muscles help keep everything in the body in its proper place. But if a weak spot in the muscles develops, something can poke through. That is called a hernia. When this happens in the lower part of the belly (abdomen), it is called an inguinal hernia. (It takes its name from a part of the body in this region called the inguinal canal.) A weak spot in the wall of muscles lets some fat or part of the small intestine bulge through. An inguinal hernia can develop at any age. Men get them more often than women. CAUSES  In adults, an inguinal  hernia develops over time.  It can be triggered by:  Suddenly straining the muscles of the lower abdomen.  Lifting heavy objects.  Straining to have a bowel movement. Difficult bowel movements (constipation) can lead to this.  Constant coughing. This may be caused by smoking or lung disease.  Being overweight.  Being pregnant.  Working at a job that requires long periods of standing or heavy lifting.  Having had an inguinal hernia before. One type can be an emergency situation. It is called a strangulated inguinal hernia. It develops if part of the small intestine slips through the weak spot and cannot get back into the abdomen. The blood supply can be cut off. If that happens, part of the intestine may die. This situation requires emergency surgery. SYMPTOMS  Often, a small inguinal hernia has no symptoms. It is found when a healthcare provider does a physical exam. Larger hernias usually have symptoms.   In adults, symptoms may include:  A lump in the groin. This is easier to see when the person is standing. It might disappear when lying down.  In men, a lump in the scrotum.  Pain or burning in the groin. This occurs especially when lifting, straining or coughing.  A dull ache or feeling of pressure in the groin.  Signs of a strangulated hernia can include:  A bulge in the groin that becomes very painful and tender to the touch.  A bulge that turns red or purple.  Fever, nausea and vomiting.  Inability to have a bowel  movement or to pass gas. DIAGNOSIS  To decide if you have an inguinal hernia, a healthcare provider will probably do a physical examination.  This will include asking questions about any symptoms you have noticed.  The healthcare provider might feel the groin area and ask you to cough. If an inguinal hernia is felt, the healthcare provider may try to slide it back into the abdomen.  Usually no other tests are needed. TREATMENT  Treatments can vary.  The size of the hernia makes a difference. Options include:  Watchful waiting. This is often suggested if the hernia is small and you have had no symptoms.  No medical procedure will be done unless symptoms develop.  You will need to watch closely for symptoms. If any occur, contact your healthcare provider right away.  Surgery. This is used if the hernia is larger or you have symptoms.  Open surgery. This is usually an outpatient procedure (you will not stay overnight in a hospital). An cut (incision) is made through the skin in the groin. The hernia is put back inside the abdomen. The weak area in the muscles is then repaired by herniorrhaphy or hernioplasty. Herniorrhaphy: in this type of surgery, the weak muscles are sewn back together. Hernioplasty: a patch or mesh is used to close the weak area in the abdominal wall.  Laparoscopy. In this procedure, a surgeon makes small incisions. A thin tube with a tiny video camera (called a laparoscope) is put into the abdomen. The surgeon repairs the hernia with mesh by looking with the video camera and using two long instruments. HOME CARE INSTRUCTIONS   After surgery to repair an inguinal hernia:  You will need to take pain medicine prescribed by your healthcare provider. Follow all directions carefully.  You will need to take care of the wound from the incision.  Your activity will be restricted for awhile. This will probably include no heavy lifting for several weeks. You also should not do anything too active for a few weeks. When you can return to work will depend on the type of job that you have.  During "watchful waiting" periods, you should:  Maintain a healthy weight.  Eat a diet high in fiber (fruits, vegetables and whole grains).  Drink plenty of fluids to avoid constipation. This means drinking enough water and other liquids to keep your urine clear or pale yellow.  Do not lift heavy objects.  Do not stand for long periods of  time.  Quit smoking. This should keep you from developing a frequent cough. SEEK MEDICAL CARE IF:   A bulge develops in your groin area.  You feel pain, a burning sensation or pressure in the groin. This might be worse if you are lifting or straining.  You develop a fever of more than 100.5 F (38.1 C). SEEK IMMEDIATE MEDICAL CARE IF:   Pain in the groin increases suddenly.  A bulge in the groin gets bigger suddenly and does not go down.  For men, there is sudden pain in the scrotum. Or, the size of the scrotum increases.  A bulge in the groin area becomes red or purple and is painful to touch.  You have nausea or vomiting that does not go away.  You feel your heart beating much faster than normal.  You cannot have a bowel movement or pass gas.  You develop a fever of more than 102.0 F (38.9 C).   This information is not intended to replace advice given to you  by your health care provider. Make sure you discuss any questions you have with your health care provider.   Document Released: 12/29/2008 Document Revised: 11/04/2011 Document Reviewed: 02/13/2015 Elsevier Interactive Patient Education [employer].

## 2016-05-21 NOTE — Progress Notes (Signed)
Patient ID: Dennis Cohen, male    DOB: [birth year], 50 y.o.   MRN: [id number]  PCP: No PCP Per Patient  Chief Complaint  Patient presents with  . Inguinal Hernia    poss rt side    Subjective:   HPI 50 year old presents for evaluation of possible hernia times 5-6 weeks ago. He reports that hernia is located right groin superior to right testicle. He reports that the hernia is painful and throbbing. Pain is most pronounced with  coughing, sneezing, or lifting.  Hernia disappears with lying flat and reappears with standing.  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  Social History   Social History  . Marital status: Divorced    Spouse name: N/A  . Number of children: N/A  . Years of education: N/A   Occupational History  . Not on file.   Social History Main Topics  . Smoking status: Never Smoker  . Smokeless tobacco: Never Used  . Alcohol use No  . Drug use: No  . Sexual activity: Not on file   Other Topics Concern  . Not on file   Social History Narrative  . No narrative on file   Family History  Problem Relation Age of Onset  . Hypertension Mother   . High Cholesterol Father   . Heart attack Father   . Healthy Sister   . Healthy Brother    Review of Systems  See HPI  Patient Active Problem List   Diagnosis Date Noted  . Hydrocele, left 10/11/2013     Prior to Admission medications   Medication Sig Start Date End Date Taking? Authorizing Provider  ibuprofen (ADVIL,MOTRIN) 200 MG tablet Take 200 mg by mouth every 6 (six) hours as needed.   Yes Historical Provider, MD  oxyCODONE-acetaminophen (PERCOCET) 10-325 MG per tablet Take 1-2 tablets by mouth every 4 (four) hours as needed for pain. Maximum dose per 24 hours - 8 pills 10/11/13  Yes Kathie Rhodes, MD   No Known Allergies   Objective:  Physical  Exam  Constitutional: He is oriented to person, place, and time. He appears well-developed and well-nourished.  HENT:  Head: Normocephalic and atraumatic.  Right Ear: External ear normal.  Left Ear: External ear normal.  Eyes: Conjunctivae and EOM are normal. Pupils are equal, round, and reactive to light.  Neck: Normal range of motion.  Cardiovascular: Normal rate and intact distal pulses.   Pulmonary/Chest: Effort normal.  Abdominal: Soft. He exhibits no mass. There is no tenderness. There is no rebound and no guarding.  Genitourinary:  Genitourinary Comments: Right large mass noted in groin region superiorly above right testicle. Reducible mass while patient lies supinely.    Musculoskeletal: Normal range of motion.  Neurological: He is alert and oriented to person, place, and time.  Skin: Skin is warm and dry.  Psychiatric: He has a normal mood and affect. His behavior is normal. Judgment and thought content normal.    Vitals:   05/21/16 1017  BP: (!) 154/80  Pulse: 67  Resp: 16  Temp: 98.4 F (36.9 C)    Assessment & Plan:  1. Right inguinal hernia, reducible, stable. No visible indication of incarceration or strangulation. Plan: - Ambulatory referral to General Surgery at [facility] October 2nd, 2017. -Work restrictions include limit lifting to 10 lbs or  less. -Red Flags indicating worsen of condition discussed and provided to patient in writing.  Carroll Sage. Kenton Kingfisher, MSN, FNP-C [facility]

## 2016-05-27 ENCOUNTER — Ambulatory Visit: Payer: Self-pay | Admitting: Surgery

## 2016-05-27 ENCOUNTER — Encounter: Payer: Self-pay | Admitting: Surgery

## 2016-05-27 DIAGNOSIS — K4090 Unilateral inguinal hernia, without obstruction or gangrene, not specified as recurrent: Secondary | ICD-10-CM

## 2016-05-27 DIAGNOSIS — K4020 Bilateral inguinal hernia, without obstruction or gangrene, not specified as recurrent: Secondary | ICD-10-CM | POA: Insufficient documentation

## 2016-05-27 NOTE — H&P (Signed)
Dennis Cohen. [facility] 05/27/2016 4:13 PM Location: [facility] Patient #: [id number] DOB: Feb 23, 1966 Divorced / Language: Dennis Cohen / Race: White Male  History of Present Illness [name] MD; 05/27/2016 5:02 PM) The patient is a 50 year old male who presents with an inguinal hernia. Note for "Inguinal hernia": Patient sent by [name], family nurse practitioner at [facility]. Concern for right inguinal hernia.  Pleasant active male. Had been open left inguinal hernia in 2003 with mesh by Dr. [name] whom has since retired. Also had a large hydrocele on the left that Dr. [name] then with [facility] removed in 2015. About 6 weeks ago he felt some right groin pain and discomfort on the right side. Noticed some bulging. Reducible when he laid down. He was concerned he had an hernia now on the right side. He has to do some heavy lifting at work and has not been able to work as well needing ibuprofen. Usually moves his bowels once or twice a day. No problems with urination or defecation. Does not smoke. Rather active.   Other Problems [name], CMA; 05/27/2016 4:13 PM) Inguinal Hernia  Past Surgical History [name], CMA; 05/27/2016 4:13 PM) Knee Surgery Bilateral. Open Inguinal Hernia Surgery Left.  Diagnostic Studies History [name], CMA; 05/27/2016 4:13 PM) Colonoscopy never  Allergies ([name], CMA; 05/27/2016 4:14 PM) No Known Drug Allergies 05/27/2016  Medication History ([name], CMA; 05/27/2016 4:14 PM) Advil (200MG  Capsule, Oral as needed) Active. Medications Reconciled  Social History [name], CMA; 05/27/2016 4:13 PM) Alcohol use Occasional alcohol use. Caffeine use Carbonated beverages, Tea. No drug use Tobacco use Former smoker.  Family History [name], CMA; 05/27/2016 4:13 PM) Heart Disease Father. Ischemic Bowel Disease Father. Melanoma Father.     Review of Systems [name]  Bynum CMA; 05/27/2016 4:13 PM) General Not Present- Appetite Loss, Chills, Fatigue, Fever, Night Sweats, Weight Gain and Weight Loss. Skin Not Present- Change in Wart/Mole, Dryness, Hives, Jaundice, New Lesions, Non-Healing Wounds, Rash and Ulcer. HEENT Present- Wears glasses/contact lenses. Not Present- Earache, Hearing Loss, Hoarseness, Nose Bleed, Oral Ulcers, Ringing in the Ears, Seasonal Allergies, Sinus Pain, Sore Throat, Visual Disturbances and Yellow Eyes. Respiratory Not Present- Bloody sputum, Chronic Cough, Difficulty Breathing, Snoring and Wheezing. Breast Not Present- Breast Mass, Breast Pain, Nipple Discharge and Skin Changes. Cardiovascular Not Present- Chest Pain, Difficulty Breathing Lying Down, Leg Cramps, Palpitations, Rapid Heart Rate, Shortness of Breath and Swelling of Extremities. Gastrointestinal Not Present- Abdominal Pain, Bloating, Bloody Stool, Change in Bowel Habits, Chronic diarrhea, Constipation, Difficulty Swallowing, Excessive gas, Gets full quickly at meals, Hemorrhoids, Indigestion, Nausea, Rectal Pain and Vomiting. Male Genitourinary Not Present- Blood in Urine, Change in Urinary Stream, Frequency, Impotence, Nocturia, Painful Urination, Urgency and Urine Leakage. Musculoskeletal Not Present- Back Pain, Joint Pain, Joint Stiffness, Muscle Pain, Muscle Weakness and Swelling of Extremities. Neurological Not Present- Decreased Memory, Fainting, Headaches, Numbness, Seizures, Tingling, Tremor, Trouble walking and Weakness. Psychiatric Not Present- Anxiety, Bipolar, Change in Sleep Pattern, Depression, Fearful and Frequent crying. Endocrine Not Present- Cold Intolerance, Excessive Hunger, Hair Changes, Heat Intolerance, Hot flashes and New Diabetes. Hematology Not Present- Blood Thinners, Easy Bruising, Excessive bleeding, Gland problems, HIV and Persistent Infections.  Vitals ([name] CMA; 05/27/2016 4:14 PM) 05/27/2016 4:14 PM Weight: 187 lb Height: 67in Body  Surface Area: 1.97 m Body Mass Index: 29.29 kg/m  Temp.: 6F(Temporal)  Pulse: 96 (Regular)  BP: 126/78 (Sitting, Left Arm, Standard)      Physical  Exam [name] MD; 05/27/2016 4:59 PM)  General Mental Status-Alert. General Appearance-Not in acute distress, Not Sickly. Orientation-Oriented X3. Hydration-Well hydrated. Voice-Normal.  Integumentary Global Assessment Upon inspection and palpation of skin surfaces of the - Axillae: non-tender, no inflammation or ulceration, no drainage. and Distribution of scalp and body hair is normal. General Characteristics Temperature - normal warmth is noted.  Head and Neck Head-normocephalic, atraumatic with no lesions or palpable masses. Face Global Assessment - atraumatic, no absence of expression. Neck Global Assessment - no abnormal movements, no bruit auscultated on the right, no bruit auscultated on the left, no decreased range of motion, non-tender. Trachea-midline. Thyroid Gland Characteristics - non-tender.  Eye Eyeball - Left-Extraocular movements intact, No Nystagmus. Eyeball - Right-Extraocular movements intact, No Nystagmus. Cornea - Left-No Hazy. Cornea - Right-No Hazy. Sclera/Conjunctiva - Left-No scleral icterus, No Discharge. Sclera/Conjunctiva - Right-No scleral icterus, No Discharge. Pupil - Left-Direct reaction to light normal. Pupil - Right-Direct reaction to light normal.  ENMT Ears Pinna - Left - no drainage observed, no generalized tenderness observed. Right - no drainage observed, no generalized tenderness observed. Nose and Sinuses External Inspection of the Nose - no destructive lesion observed. Inspection of the nares - Left - quiet respiration. Right - quiet respiration. Mouth and Throat Lips - Upper Lip - no fissures observed, no pallor noted. Lower Lip - no fissures observed, no pallor noted. Nasopharynx - no discharge present. Oral Cavity/Oropharynx -  Tongue - no dryness observed. Oral Mucosa - no cyanosis observed. Hypopharynx - no evidence of airway distress observed.  Chest and Lung Exam Inspection Movements - Normal and Symmetrical. Accessory muscles - No use of accessory muscles in breathing. Palpation Palpation of the chest reveals - Non-tender. Auscultation Breath sounds - Normal and Clear.  Cardiovascular Auscultation Rhythm - Regular. Murmurs & Other Heart Sounds - Auscultation of the heart reveals - No Murmurs and No Systolic Clicks.  Abdomen Inspection Inspection of the abdomen reveals - No Visible peristalsis and No Abnormal pulsations. Umbilicus - No Bleeding, No Urine drainage. Palpation/Percussion Palpation and Percussion of the abdomen reveal - Soft, Non Tender, No Rebound tenderness, No Rigidity (guarding) and No Cutaneous hyperesthesia. Note: Abdomen soft. Nontender, nondistended. No guarding. No umbilical no other hernias. Mild diastases recti.  Male Genitourinary Sexual Maturity Tanner 5 - Adult hair pattern and Adult penile size and shape. Note: Obvious bulging to right scrotum but reducible consistent with moderately large right inguinal hernia. No giant hydrocele or varicocele felt. No recurrent left inguinal hernia. Normal external genitalia. Epididymi, testes, and spermatic cords normal without any masses.  Peripheral Vascular Upper Extremity Inspection - Left - No Cyanotic nailbeds, Not Ischemic. Right - No Cyanotic nailbeds, Not Ischemic.  Neurologic Neurologic evaluation reveals -normal attention span and ability to concentrate, able to name objects and repeat phrases. Appropriate fund of knowledge , normal sensation and normal coordination. Mental Status Affect - not angry, not paranoid. Cranial Nerves-Normal Bilaterally. Gait-Normal.  Neuropsychiatric Mental status exam performed with findings of-able to articulate well with normal speech/language, rate, volume and coherence, thought  content normal with ability to perform basic computations and apply abstract reasoning and no evidence of hallucinations, delusions, obsessions or homicidal/suicidal ideation.  Musculoskeletal Global Assessment Spine, Ribs and Pelvis - no instability, subluxation or laxity. Right Upper Extremity - no instability, subluxation or laxity.  Lymphatic Head & Neck  General Head & Neck Lymphatics: Bilateral - Description - No Localized lymphadenopathy. Axillary  General Axillary Region: Bilateral - Description - No Localized lymphadenopathy.  Femoral & Inguinal  Generalized Femoral & Inguinal Lymphatics: Left - Description - No Localized lymphadenopathy. Right - Description - No Localized lymphadenopathy.    Assessment & Plan [name] MD; 05/27/2016 4:57 PM)  RIGHT INGUINAL HERNIA (K40.90) Impression: Obvious right inguinal hernia going down to the scrotum. Reducible though. No evidence of left inguinal hernia.  I think he would benefit from surgical repair. Reasonable to do a laparoscopic approach. Because its affecting his ability to work, he would like to get it done as soon as possible.  PREOP - ING HERNIA - ENCOUNTER FOR PREOPERATIVE EXAMINATION FOR GENERAL SURGICAL PROCEDURE (Z01.818)  Current Plans You are being scheduled for surgery - Our schedulers will call you.  You should hear from our office's scheduling department within 5 working days about the location, date, and time of surgery. We try to make accommodations for patient's preferences in scheduling surgery, but sometimes the OR schedule or the surgeon's schedule prevents [place] from making those accommodations.  If you have not heard from our office [phone]) in 5 working days, call the office and ask for your surgeon's nurse.  If you have other questions about your diagnosis, plan, or surgery, call the office and ask for your surgeon's nurse.  Written instructions provided The anatomy & physiology of the  abdominal wall and pelvic floor was discussed. The pathophysiology of hernias in the inguinal and pelvic region was discussed. Natural history risks such as progressive enlargement, pain, incarceration, and strangulation was discussed. Contributors to complications such as smoking, obesity, diabetes, prior surgery, etc were discussed.  I feel the risks of no intervention will lead to serious problems that outweigh the operative risks; therefore, I recommended surgery to reduce and repair the hernia. I explained laparoscopic techniques with possible need for an open approach. I noted usual use of mesh to patch and/or buttress hernia repair  Risks such as bleeding, infection, abscess, need for further treatment, heart attack, death, and other risks were discussed. I noted a good likelihood this will help address the problem. Goals of post-operative recovery were discussed as well. Possibility that this will not correct all symptoms was explained. I stressed the importance of low-impact activity, aggressive pain control, avoiding constipation, & not pushing through pain to minimize risk of post-operative chronic pain or injury. Possibility of reherniation was discussed. We will work to minimize complications.  An educational handout further explaining the pathology & treatment options was given as well. Questions were answered. The patient expresses understanding & wishes to proceed with surgery.  Pt Education - Pamphlet Given - Laparoscopic Hernia Repair: discussed with patient and provided information. Pt Education - [facility]CS Pain Control (Jebadiah Imperato) Pt Education - [facility] Hernia Post-Op HCI (Emmersyn Kratzke): discussed with patient and provided information.  [name], M.D., F.A.C.S. Gastrointestinal and Minimally Invasive Surgery [facility] [address], [address], [place] [address] [phone] Main / Paging

## 2016-06-17 NOTE — Patient Instructions (Addendum)
Dennis Cohen  06/17/2016   Your procedure is scheduled on: 06/20/16  Report to [facility] Main  Entrance take [facility]  elevators to 3rd floor to  [place] at  0900 AM.  Call this number if you have problems the morning of surgery [phone]   Remember: ONLY 1 PERSON MAY GO WITH YOU TO SHORT STAY TO GET  READY MORNING OF YOUR SURGERY.  Do not eat food or drink liquids :After Midnight.     Take these medicines the morning of surgery with A SIP OF WATER: none DO NOT TAKE ANY DIABETIC MEDICATIONS DAY OF YOUR SURGERY                               You may not have any metal on your body including hair pins and              piercings  Do not wear jewelry, make-up, lotions, powders or perfumes, deodorant             Do not wear nail polish.  Do not shave  48 hours prior to surgery.              Men may shave face and neck.   Do not bring valuables to the hospital. [place].  Contacts, dentures or bridgework may not be worn into surgery.  Leave suitcase in the car. After surgery it may be brought to your room.     Patients discharged the day of surgery will not be allowed to drive home.  Name and phone number of your driver:  Dennis Cohen or Dennis Cohen  Special Instructions: N/A              Please read over the following fact sheets you were given: _____________________________________________________________________             [facility] - Preparing for Surgery Before surgery, you can play an important role.  Because skin is not sterile, your skin needs to be as free of germs as possible.  You can reduce the number of germs on your skin by washing with CHG (chlorahexidine gluconate) soap before surgery.  CHG is an antiseptic cleaner which kills germs and bonds with the skin to continue killing germs even after washing. Please DO NOT use if you have an allergy to CHG or antibacterial soaps.  If your  skin becomes reddened/irritated stop using the CHG and inform your nurse when you arrive at Short Stay. Do not shave (including legs and underarms) for at least 48 hours prior to the first CHG shower.  You may shave your face/neck. Please follow these instructions carefully:  1.  Shower with CHG Soap the night before surgery and the  morning of Surgery.  2.  If you choose to wash your hair, wash your hair first as usual with your  normal  shampoo.  3.  After you shampoo, rinse your hair and body thoroughly to remove the  shampoo.                           4.  Use CHG as you would any other liquid soap.  You can apply chg directly  to the skin and wash                       Gently with a scrungie or clean washcloth.  5.  Apply the CHG Soap to your body ONLY FROM THE NECK DOWN.   Do not use on face/ open                           Wound or open sores. Avoid contact with eyes, ears mouth and genitals (private parts).                       Wash face,  Genitals (private parts) with your normal soap.             6.  Wash thoroughly, paying special attention to the area where your surgery  will be performed.  7.  Thoroughly rinse your body with warm water from the neck down.  8.  DO NOT shower/wash with your normal soap after using and rinsing off  the CHG Soap.                9.  Pat yourself dry with a clean towel.            10.  Wear clean pajamas.            11.  Place clean sheets on your bed the night of your first shower and do not  sleep with pets. Day of Surgery : Do not apply any lotions/deodorants the morning of surgery.  Please wear clean clothes to the hospital/surgery center.  FAILURE TO FOLLOW THESE INSTRUCTIONS MAY RESULT IN THE CANCELLATION OF YOUR SURGERY PATIENT SIGNATURE_________________________________  NURSE SIGNATURE__________________________________  ________________________________________________________________________

## 2016-06-18 ENCOUNTER — Encounter (HOSPITAL_COMMUNITY)
Admission: RE | Admit: 2016-06-18 | Discharge: 2016-06-18 | Disposition: A | Discharge status: Home / Self Care | Payer: BLUE CROSS/BLUE SHIELD | Source: Ambulatory Visit | Attending: Surgery | Admitting: Surgery

## 2016-06-18 ENCOUNTER — Encounter (HOSPITAL_COMMUNITY): Payer: Self-pay

## 2016-06-18 DIAGNOSIS — Z01812 Encounter for preprocedural laboratory examination: Secondary | ICD-10-CM

## 2016-06-18 DIAGNOSIS — G8929 Other chronic pain: Secondary | ICD-10-CM | POA: Diagnosis not present

## 2016-06-18 DIAGNOSIS — Z79891 Long term (current) use of opiate analgesic: Secondary | ICD-10-CM | POA: Diagnosis not present

## 2016-06-18 DIAGNOSIS — Z9889 Other specified postprocedural states: Secondary | ICD-10-CM | POA: Diagnosis not present

## 2016-06-18 DIAGNOSIS — K4090 Unilateral inguinal hernia, without obstruction or gangrene, not specified as recurrent: Secondary | ICD-10-CM

## 2016-06-18 DIAGNOSIS — D176 Benign lipomatous neoplasm of spermatic cord: Secondary | ICD-10-CM | POA: Diagnosis not present

## 2016-06-18 DIAGNOSIS — Z87891 Personal history of nicotine dependence: Secondary | ICD-10-CM | POA: Diagnosis not present

## 2016-06-18 HISTORY — DX: Heartburn: R12

## 2016-06-18 LAB — CBC
HCT: 43.7 % (ref 39.0–52.0)
Hemoglobin: 15.7 g/dL (ref 13.0–17.0)
MCH: 30.5 pg (ref 26.0–34.0)
MCHC: 35.9 g/dL (ref 30.0–36.0)
MCV: 84.9 fL (ref 78.0–100.0)
PLATELETS: 257 10*3/uL (ref 150–400)
RBC: 5.15 MIL/uL (ref 4.22–5.81)
RDW: 12.8 % (ref 11.5–15.5)
WBC: 8.2 10*3/uL (ref 4.0–10.5)

## 2016-06-20 ENCOUNTER — Encounter (HOSPITAL_COMMUNITY): Payer: Self-pay | Admitting: *Deleted

## 2016-06-20 ENCOUNTER — Ambulatory Visit (HOSPITAL_COMMUNITY): Payer: BLUE CROSS/BLUE SHIELD | Admitting: Certified Registered Nurse Anesthetist

## 2016-06-20 ENCOUNTER — Encounter (HOSPITAL_COMMUNITY)
Admission: RE | Disposition: A | Discharge status: Home / Self Care | Payer: Self-pay | Source: Ambulatory Visit | Attending: Surgery

## 2016-06-20 ENCOUNTER — Ambulatory Visit (HOSPITAL_COMMUNITY)
Admission: RE | Admit: 2016-06-20 | Discharge: 2016-06-20 | Disposition: A | Discharge status: Home / Self Care | Payer: BLUE CROSS/BLUE SHIELD | Source: Ambulatory Visit | Attending: Surgery | Admitting: Surgery

## 2016-06-20 DIAGNOSIS — R12 Heartburn: Secondary | ICD-10-CM | POA: Insufficient documentation

## 2016-06-20 DIAGNOSIS — G8929 Other chronic pain: Secondary | ICD-10-CM | POA: Insufficient documentation

## 2016-06-20 DIAGNOSIS — Z87891 Personal history of nicotine dependence: Secondary | ICD-10-CM | POA: Insufficient documentation

## 2016-06-20 DIAGNOSIS — K4090 Unilateral inguinal hernia, without obstruction or gangrene, not specified as recurrent: Secondary | ICD-10-CM | POA: Insufficient documentation

## 2016-06-20 DIAGNOSIS — Z9889 Other specified postprocedural states: Secondary | ICD-10-CM | POA: Insufficient documentation

## 2016-06-20 DIAGNOSIS — F411 Generalized anxiety disorder: Secondary | ICD-10-CM

## 2016-06-20 DIAGNOSIS — D176 Benign lipomatous neoplasm of spermatic cord: Secondary | ICD-10-CM | POA: Insufficient documentation

## 2016-06-20 DIAGNOSIS — Z79891 Long term (current) use of opiate analgesic: Secondary | ICD-10-CM | POA: Insufficient documentation

## 2016-06-20 DIAGNOSIS — K4020 Bilateral inguinal hernia, without obstruction or gangrene, not specified as recurrent: Secondary | ICD-10-CM | POA: Insufficient documentation

## 2016-06-20 HISTORY — PX: INGUINAL HERNIA REPAIR: SHX194

## 2016-06-20 HISTORY — PX: INSERTION OF MESH: SHX5868

## 2016-06-20 SURGERY — REPAIR, HERNIA, INGUINAL, LAPAROSCOPIC
Anesthesia: General | Site: Inguinal | Laterality: Bilateral

## 2016-06-20 MED ORDER — STERILE WATER FOR IRRIGATION IR SOLN
Status: DC | PRN
  Administered 2016-06-20: 2000 mL

## 2016-06-20 MED ORDER — FENTANYL CITRATE (PF) 100 MCG/2ML IJ SOLN
25.0000 ug | INTRAMUSCULAR | Status: DC | PRN
  Administered 2016-06-20 (×2): 50 ug via INTRAVENOUS

## 2016-06-20 MED ORDER — ONDANSETRON HCL 4 MG/2ML IJ SOLN
INTRAMUSCULAR | Status: AC
  Filled 2016-06-20: qty 2

## 2016-06-20 MED ORDER — CHLORHEXIDINE GLUCONATE CLOTH 2 % EX PADS: 6.0000 | MEDICATED_PAD | Freq: Once | CUTANEOUS | Status: DC

## 2016-06-20 MED ORDER — ROCURONIUM BROMIDE 10 MG/ML (PF) SYRINGE
PREFILLED_SYRINGE | INTRAVENOUS | Status: DC | PRN
  Administered 2016-06-20: 10 mg via INTRAVENOUS
  Administered 2016-06-20: 50 mg via INTRAVENOUS
  Administered 2016-06-20: 10 mg via INTRAVENOUS

## 2016-06-20 MED ORDER — CEFAZOLIN SODIUM-DEXTROSE 2-4 GM/100ML-% IV SOLN
2.0000 g | INTRAVENOUS | Status: AC
  Administered 2016-06-20: 2 g via INTRAVENOUS

## 2016-06-20 MED ORDER — GLYCOPYRROLATE 0.2 MG/ML IJ SOLN
INTRAMUSCULAR | Status: DC | PRN
  Administered 2016-06-20 (×2): 0.2 mg via INTRAVENOUS

## 2016-06-20 MED ORDER — MIDAZOLAM HCL 2 MG/2ML IJ SOLN
INTRAMUSCULAR | Status: AC
  Filled 2016-06-20: qty 2

## 2016-06-20 MED ORDER — CELECOXIB 200 MG PO CAPS
400.0000 mg | ORAL_CAPSULE | ORAL | Status: AC
  Administered 2016-06-20: 400 mg via ORAL
  Filled 2016-06-20: qty 2

## 2016-06-20 MED ORDER — ROCURONIUM BROMIDE 10 MG/ML (PF) SYRINGE: PREFILLED_SYRINGE | INTRAVENOUS | Status: DC | PRN

## 2016-06-20 MED ORDER — PROMETHAZINE HCL 25 MG/ML IJ SOLN: 6.2500 mg | INTRAMUSCULAR | Status: DC | PRN

## 2016-06-20 MED ORDER — ROCURONIUM BROMIDE 10 MG/ML (PF) SYRINGE
PREFILLED_SYRINGE | INTRAVENOUS | Status: AC
  Filled 2016-06-20: qty 10

## 2016-06-20 MED ORDER — SODIUM CHLORIDE 0.9 % IJ SOLN
INTRAMUSCULAR | Status: AC
  Filled 2016-06-20: qty 50

## 2016-06-20 MED ORDER — ACETAMINOPHEN 500 MG PO TABS
1000.0000 mg | ORAL_TABLET | ORAL | Status: AC
  Administered 2016-06-20: 1000 mg via ORAL
  Filled 2016-06-20: qty 2

## 2016-06-20 MED ORDER — PROPOFOL 10 MG/ML IV BOLUS
INTRAVENOUS | Status: DC | PRN
  Administered 2016-06-20: 200 mg via INTRAVENOUS

## 2016-06-20 MED ORDER — PHENYLEPHRINE 40 MCG/ML (10ML) SYRINGE FOR IV PUSH (FOR BLOOD PRESSURE SUPPORT)
PREFILLED_SYRINGE | INTRAVENOUS | Status: DC | PRN
  Administered 2016-06-20 (×3): 80 ug via INTRAVENOUS

## 2016-06-20 MED ORDER — FENTANYL CITRATE (PF) 100 MCG/2ML IJ SOLN
INTRAMUSCULAR | Status: AC
  Filled 2016-06-20: qty 2

## 2016-06-20 MED ORDER — CEFAZOLIN SODIUM-DEXTROSE 2-4 GM/100ML-% IV SOLN
INTRAVENOUS | Status: AC
  Filled 2016-06-20: qty 100

## 2016-06-20 MED ORDER — FENTANYL CITRATE (PF) 250 MCG/5ML IJ SOLN
INTRAMUSCULAR | Status: AC
  Filled 2016-06-20: qty 5

## 2016-06-20 MED ORDER — BUPIVACAINE HCL (PF) 0.5 % IJ SOLN
INTRAMUSCULAR | Status: AC
  Filled 2016-06-20: qty 30

## 2016-06-20 MED ORDER — LIDOCAINE 2% (20 MG/ML) 5 ML SYRINGE
INTRAMUSCULAR | Status: AC
  Filled 2016-06-20: qty 5

## 2016-06-20 MED ORDER — ROCURONIUM BROMIDE 100 MG/10ML IV SOLN: INTRAVENOUS | Status: DC | PRN

## 2016-06-20 MED ORDER — TRAMADOL HCL 50 MG PO TABS
100.0000 mg | ORAL_TABLET | Freq: Once | ORAL | Status: AC
  Administered 2016-06-20: 100 mg via ORAL
  Filled 2016-06-20: qty 2

## 2016-06-20 MED ORDER — BUPIVACAINE-EPINEPHRINE (PF) 0.5% -1:200000 IJ SOLN
INTRAMUSCULAR | Status: AC
  Filled 2016-06-20: qty 3.6

## 2016-06-20 MED ORDER — GABAPENTIN 300 MG PO CAPS
300.0000 mg | ORAL_CAPSULE | ORAL | Status: AC
  Administered 2016-06-20: 300 mg via ORAL
  Filled 2016-06-20: qty 1

## 2016-06-20 MED ORDER — FENTANYL CITRATE (PF) 100 MCG/2ML IJ SOLN
INTRAMUSCULAR | Status: DC | PRN
  Administered 2016-06-20 (×6): 50 ug via INTRAVENOUS

## 2016-06-20 MED ORDER — LIDOCAINE 2% (20 MG/ML) 5 ML SYRINGE
INTRAMUSCULAR | Status: DC | PRN
  Administered 2016-06-20: 100 mg via INTRAVENOUS

## 2016-06-20 MED ORDER — DEXAMETHASONE SODIUM PHOSPHATE 10 MG/ML IJ SOLN
INTRAMUSCULAR | Status: DC | PRN
  Administered 2016-06-20: 10 mg via INTRAVENOUS

## 2016-06-20 MED ORDER — 0.9 % SODIUM CHLORIDE (POUR BTL) OPTIME
TOPICAL | Status: DC | PRN
  Administered 2016-06-20: 1000 mL

## 2016-06-20 MED ORDER — DEXAMETHASONE SODIUM PHOSPHATE 10 MG/ML IJ SOLN
INTRAMUSCULAR | Status: AC
  Filled 2016-06-20: qty 1

## 2016-06-20 MED ORDER — MIDAZOLAM HCL 5 MG/5ML IJ SOLN
INTRAMUSCULAR | Status: DC | PRN
  Administered 2016-06-20: 2 mg via INTRAVENOUS

## 2016-06-20 MED ORDER — LACTATED RINGERS IV SOLN
INTRAVENOUS | Status: DC
  Administered 2016-06-20 (×2): via INTRAVENOUS

## 2016-06-20 MED ORDER — ONDANSETRON HCL 4 MG/2ML IJ SOLN
INTRAMUSCULAR | Status: DC | PRN
  Administered 2016-06-20: 4 mg via INTRAVENOUS

## 2016-06-20 MED ORDER — TRAMADOL HCL 50 MG PO TABS: 50.0000 mg | ORAL_TABLET | Freq: Four times a day (QID) | ORAL | 0 refills | Status: AC | PRN

## 2016-06-20 MED ORDER — BUPIVACAINE HCL 0.5 % IJ SOLN
INTRAMUSCULAR | Status: DC | PRN
  Administered 2016-06-20: 40 mL

## 2016-06-20 MED ORDER — SUGAMMADEX SODIUM 200 MG/2ML IV SOLN
INTRAVENOUS | Status: DC | PRN
  Administered 2016-06-20: 200 mg via INTRAVENOUS

## 2016-06-20 SURGICAL SUPPLY — 35 items
CABLE HIGH FREQUENCY MONO STRZ (ELECTRODE) ×3 IMPLANT
CHLORAPREP W/TINT 26ML (MISCELLANEOUS) ×3 IMPLANT
COVER SURGICAL LIGHT HANDLE (MISCELLANEOUS) ×3 IMPLANT
DECANTER SPIKE VIAL GLASS SM (MISCELLANEOUS) ×3 IMPLANT
DEVICE SECURE STRAP 25 ABSORB (INSTRUMENTS) IMPLANT
DRAPE WARM FLUID 44X44 (DRAPE) ×3 IMPLANT
DRSG TEGADERM 2-3/8X2-3/4 SM (GAUZE/BANDAGES/DRESSINGS) ×6 IMPLANT
DRSG TEGADERM 4X4.75 (GAUZE/BANDAGES/DRESSINGS) ×3 IMPLANT
ELECT REM PT RETURN 9FT ADLT (ELECTROSURGICAL) ×3
ELECTRODE REM PT RTRN 9FT ADLT (ELECTROSURGICAL) ×1 IMPLANT
GAUZE SPONGE 2X2 8PLY STRL LF (GAUZE/BANDAGES/DRESSINGS) ×1 IMPLANT
GLOVE ECLIPSE 8.0 STRL XLNG CF (GLOVE) ×3 IMPLANT
GLOVE INDICATOR 8.0 STRL GRN (GLOVE) ×3 IMPLANT
GOWN STRL REUS W/TWL XL LVL3 (GOWN DISPOSABLE) ×6 IMPLANT
IRRIG SUCT STRYKERFLOW 2 WTIP (MISCELLANEOUS) ×3
IRRIGATION SUCT STRKRFLW 2 WTP (MISCELLANEOUS) ×1 IMPLANT
KIT BASIN OR (CUSTOM PROCEDURE TRAY) ×3 IMPLANT
MESH ULTRAPRO 6X6 15CM15CM (Mesh General) ×9 IMPLANT
PAD POSITIONING PINK XL (MISCELLANEOUS) ×3 IMPLANT
SCISSORS LAP 5X35 DISP (ENDOMECHANICALS) ×3 IMPLANT
SLEEVE ADV FIXATION 5X100MM (TROCAR) ×3 IMPLANT
SPONGE GAUZE 2X2 STER 10/PKG (GAUZE/BANDAGES/DRESSINGS) ×2
STAPLER VISISTAT 35W (STAPLE) ×3 IMPLANT
SUT MNCRL AB 4-0 PS2 18 (SUTURE) ×3 IMPLANT
SUT PDS AB 1 CT1 27 (SUTURE) ×3 IMPLANT
SUT VIC AB 2-0 SH 27 (SUTURE) ×6
SUT VIC AB 2-0 SH 27X BRD (SUTURE) ×3 IMPLANT
SUT VIC AB 3-0 SH 27 (SUTURE)
SUT VIC AB 3-0 SH 27XBRD (SUTURE) IMPLANT
TACKER 5MM HERNIA 3.5CML NAB (ENDOMECHANICALS) IMPLANT
TOWEL OR 17X26 10 PK STRL BLUE (TOWEL DISPOSABLE) ×3 IMPLANT
TRAY LAPAROSCOPIC (CUSTOM PROCEDURE TRAY) ×3 IMPLANT
TROCAR ADV FIXATION 5X100MM (TROCAR) ×3 IMPLANT
TROCAR XCEL BLUNT TIP 100MML (ENDOMECHANICALS) ×3 IMPLANT
TUBING INSUF HEATED (TUBING) ×3 IMPLANT

## 2016-06-20 NOTE — Op Note (Signed)
06/20/2016  2:46 PM  PATIENT:  Dennis Cohen  50 y.o. male  Patient Care Team: No Pcp Per Patient as PCP - General ([place]) Michael Boston, MD as Consulting Physician (General Surgery)  PRE-OPERATIVE DIAGNOSIS:  RIGHT INGUINAL HERNIA. HISTORY OF LEFT INGUINAL HERNIA REPAIR  POST-OPERATIVE DIAGNOSIS:  RIGHT INGUINAL HERNIA. HISTORY OF LEFT INGUINAL   PROCEDURE:  Procedure(s): LAPAROSCOPIC REPAIR OF BILATERAL INGUINAL HERNIAS INSERTION OF MESH  SURGEON:  Surgeon(s): Michael Boston, MD  ASSISTANT: RN   ANESTHESIA:   Regional ilioinguinal and genitofemoral and spermatic cord nerve blocks with GETA  EBL:  Total I/O In: 1000 [I.V.:1000] Out: 10 [Blood:10]  Delay start of Pharmacological VTE agent (>24hrs) due to surgical blood loss or risk of bleeding:  no  DRAINS: NONE  SPECIMEN:   NONE  DISPOSITION OF SPECIMEN:  N/A  COUNTS:  YES  PLAN OF CARE: Discharge to home after PACU  PATIENT DISPOSITION:  PACU - hemodynamically stable.  INDICATION: Patient s/p left hydrocele repair.  History of chronic lumbar issues on chronic narcotics for chronic pain.  Developed sharp right groin pain cyst with hernia.  Progressed down to the scrotum.  No definite discomfort on the left side but mildly sensitive.  I offered microscopic exploration and repair of hernias found.  The anatomy & physiology of the abdominal wall and pelvic floor was discussed.  The pathophysiology of hernias in the inguinal and pelvic region was discussed.  Natural history risks such as progressive enlargement, pain, incarceration & strangulation was discussed.   Contributors to complications such as smoking, obesity, diabetes, prior surgery, etc were discussed.    I feel the risks of no intervention will lead to serious problems that outweigh the operative risks; therefore, I recommended surgery to reduce and repair the hernia.  I explained laparoscopic techniques with possible need for an open approach.  I  noted usual use of mesh to patch and/or buttress hernia repair  Risks such as bleeding, infection, abscess, need for further treatment, heart attack, death, and other risks were discussed.  I noted a good likelihood this will help address the problem.   Goals of post-operative recovery were discussed as well.  Possibility that this will not correct all symptoms was explained.  I stressed the importance of low-impact activity, aggressive pain control, avoiding constipation, & not pushing through pain to minimize risk of post-operative chronic pain or injury. Possibility of reherniation was discussed.  We will work to minimize complications.     An educational handout further explaining the pathology & treatment options was given as well.  Questions were answered.  The patient expresses understanding & wishes to proceed with surgery.  OR FINDINGS: Patient had a large right indirect inguinal hernia going down the scrotum.  No direct space femoral or obturator hernias.  On the left side he had preperitoneal stitch consistent with ligation of a hydrocele.  Did have laxity of the internal ring with consistent with a sliding internal hernia.  No direct space femoral nor obturator hernias.  DESCRIPTION:   The patient was identified & brought into the operating room. The patient was positioned supine with arms tucked. SCDs were active during the entire case. The patient underwent general anesthesia without any difficulty.  The abdomen was prepped and draped in a sterile fashion. The patient's bladder was emptied.  A Surgical Timeout confirmed our plan.  I made a transverse incision through the inferior umbilical fold.  I made a small transverse nick through the anterior rectus fascia  contralateral to the inguinal hernia side and placed a 0-vicryl stitch through the fascia.  I placed a Hasson trocar into the preperitoneal plane.  Entry was clean.  We induced carbon dioxide insufflation. Camera inspection  revealed no injury.  I used a 69mm angled scope to bluntly free the peritoneum off the infraumbilical anterior abdominal wall.  I created enough of a preperitoneal pocket to place 14mm ports into the right & left mid-abdomen into this preperitoneal cavity.  I focused attention on the right side since that was the dominant hernia side.   I used blunt & focused sharp dissection to free the peritoneum off the flank and down to the pubic rim.  I freed the anteriolateral bladder wall off the anteriolateral pelvic wall, sparing midline attachments.   I located a swath of peritoneum going into a hernia fascial defect at the internal ring consistent with an indirect inguinal hernia.  I gradually freed the peritoneal hernia sac off safely and reduced it into the preperitoneal space.  I freed the peritoneum off the spermatic vessels & vas deferens.  I freed peritoneum off the retroperitoneum along the psoas muscle.  I skeletonized couple very large spermatic cord lipomas.  These were transected and removed.  I transected the redundant hernia sac down to a more flat area of peritoneum.  Remove that hernia sac.  I closed the peritoneal defect for a good high ligation using 2-0 Vicryl and intracorporeal suturing.    Patient did have a bleeder off a branch of the obturator and  bladder perforators.  This was isolated and controlled with touch point cautery at the stump only I checked & assured hemostasis.    I turned attention on the opposite side.  I did dissection in a similar, mirror-image fashion.  Patient had a Prolene suture at the internal ring this with ligation of the hydrocele.  He did have laxity maturing with some spermatic cord lipoma sliding into it and some of the hydrocele sac sliding nearby.  Was consistent with an indirect inguinal hernia.   freeing some scar tissue around the stitch I did have to close the peritoneal defect with 2-0 Vicryl suture for better high ligation of the sac.  A few smaller spermatic  cord lipomas were isolated, skeletonized, and removed.    I chose 15x15 cm sheets of ultra-lightweight polypropylene mesh (Ultrapro), one for each side.  I cut a single sigmoid-shaped slit ~6cm from a corner of each mesh.  I placed the meshes into the preperitoneal space & laid them as overlapping diamonds such that at the inferior points, a 6x6 cm corner flap rested in the true anterolateral pelvis, covering the obturator & femoral foramina.   I allowed the bladder to return to the pubis, this helping tuck the corners of the mesh in the anteriolateral pelvis.  The medial corners overlapped each other across midline cephalad to the pubic rim.     Because of the very large hernia defect on the right side and is increased physical activity requirements, platelets 30 mesh in the middle to have good overlap and avoid using tacks and the patient already with chronic pain issues this provided good overlap around all hernia weak areas  I held the hernia sacs cephalad & evacuated carbon dioxide.  I closed the fascia with absorbable suture.  I closed the skin using 4-0 monocryl stitch.  Sterile dressings were applied.   The patient was extubated & arrived in the PACU in stable condition..  I had discussed  postoperative care with the patient in the holding area.  Instructions are written in the chart.  I discussed operative findings, updated the patient's status, discussed probable steps to recovery, and gave postoperative recommendations to the patient's family.  Recommendations were made.  Questions were answered.  They expressed understanding & appreciation.   [name], M.D., F.A.C.S. Gastrointestinal and Minimally Invasive Surgery [facility] [address] [place], [place] [address] [phone] Main / Paging

## 2016-06-20 NOTE — Interval H&P Note (Signed)
History and Physical Interval Note:  06/20/2016 11:52 AM  Dennis Cohen  has presented today for surgery, with the diagnosis of RIGHT INGUINAL HERNIA. HISTORY OF LEFT INGUINAL HERNIA REPAIR  The various methods of treatment have been discussed with the patient and family. After consideration of risks, benefits and other options for treatment, the patient has consented to  Procedure(s): LAPAROSCOPIC REPAIR OF HERNIAS IN RIGHT AND POSSIBLY LEFT GROIN, POSSIBLE OPEN APPROACH (Bilateral) INSERTION OF MESH (Right) as a surgical intervention .  The patient's history has been reviewed, patient examined, no change in status, stable for surgery.  I have reviewed the patient's chart and labs.  Questions were answered to the patient's satisfaction.     Tylin Stradley C.

## 2016-06-20 NOTE — Transfer of Care (Signed)
Immediate Anesthesia Transfer of Care Note  Patient: Dennis Cohen  Procedure(s) Performed: Procedure(s): LAPAROSCOPIC REPAIR OF BILATERAL INGUINAL HERNIAS (Bilateral) INSERTION OF MESH (Bilateral)  Patient Location: PACU  Anesthesia Type:General  Level of Consciousness: sedated  Airway & Oxygen Therapy: Patient Spontanous Breathing and Patient connected to face mask oxygen  Post-op Assessment: Report given to RN and Post -op Vital signs reviewed and stable  Post vital signs: Reviewed and stable  Last Vitals:  Vitals:   06/20/16 0934  BP: (!) 144/87  Pulse: 94  Resp: 16  Temp: 37.1 C    Last Pain:  Vitals:   06/20/16 0934  TempSrc: Oral         Complications: No apparent anesthesia complications

## 2016-06-20 NOTE — Anesthesia Postprocedure Evaluation (Signed)
Anesthesia Post Note  Patient: Dennis Cohen  Procedure(s) Performed: Procedure(s) (LRB): LAPAROSCOPIC REPAIR OF BILATERAL INGUINAL HERNIAS (Bilateral) INSERTION OF MESH (Bilateral)  Patient location during evaluation: PACU Anesthesia Type: General Level of consciousness: awake and alert Pain management: pain level controlled Vital Signs Assessment: post-procedure vital signs reviewed and stable Respiratory status: spontaneous breathing, nonlabored ventilation, respiratory function stable and patient connected to nasal cannula oxygen Cardiovascular status: blood pressure returned to baseline and stable Postop Assessment: no signs of nausea or vomiting Anesthetic complications: no    Last Vitals:  Vitals:   06/20/16 1509 06/20/16 1515  BP:  (!) 139/96  Pulse: (!) 121 (!) 106  Resp: 13 13  Temp:      Last Pain:  Vitals:   06/20/16 1509  TempSrc:   PainSc: 0-No pain                 Catalina Gravel

## 2016-06-20 NOTE — Anesthesia Preprocedure Evaluation (Addendum)
Anesthesia Evaluation  Patient identified by MRN, date of birth, ID band Patient awake    Reviewed: Allergy & Precautions, NPO status , Patient's Chart, lab work & pertinent test results  Airway Mallampati: II  TM Distance: >3 FB Neck ROM: Full    Dental  (+) Teeth Intact, Dental Advisory Given   Pulmonary former smoker,    Pulmonary exam normal breath sounds clear to auscultation       Cardiovascular Exercise Tolerance: Good negative cardio ROS Normal cardiovascular exam Rhythm:Regular Rate:Normal     Neuro/Psych negative neurological ROS  negative psych ROS   GI/Hepatic negative GI ROS, Neg liver ROS,   Endo/Other  negative endocrine ROS  Renal/GU negative Renal ROS     Musculoskeletal negative musculoskeletal ROS (+)   Abdominal   Peds  Hematology negative hematology ROS (+)   Anesthesia Other Findings Day of surgery medications reviewed with the patient.  Reproductive/Obstetrics                            Anesthesia Physical Anesthesia Plan  ASA: I  Anesthesia Plan: General   Post-op Pain Management:    Induction: Intravenous  Airway Management Planned: Oral ETT  Additional Equipment:   Intra-op Plan:   Post-operative Plan: Extubation in OR  Informed Consent: I have reviewed the patients History and Physical, chart, labs and discussed the procedure including the risks, benefits and alternatives for the proposed anesthesia with the patient or authorized representative who has indicated his/her understanding and acceptance.   Dental advisory given  Plan Discussed with: CRNA  Anesthesia Plan Comments: (Risks/benefits of general anesthesia discussed with patient including risk of damage to teeth, lips, gum, and tongue, nausea/vomiting, allergic reactions to medications, and the possibility of heart attack, stroke and death.  All patient questions answered.  Patient wishes to  proceed.)        Anesthesia Quick Evaluation

## 2016-06-20 NOTE — H&P (View-Only) (Signed)
Dennis Dennis Cohen. [name] 05/27/2016 4:13 PM Location: [facility] Patient #: [id number] DOB: [birth year] Divorced / Language: [name] / Race: White Male  History of Present Illness [name] MD; 05/27/2016 5:02 PM) The patient is a 50 year old male who presents with an inguinal hernia. Note for "Inguinal hernia": Patient sent by [name], family nurse practitioner at [facility]. Concern for right inguinal hernia.  Pleasant active male. Had been open left inguinal hernia in 2003 with mesh by Dr. [name] whom has since retired. Also had a large hydrocele on the left that Dr. [name] then with [facility] removed in 2015. About 6 weeks ago he felt some right groin pain and discomfort on the right side. Noticed some bulging. Reducible when he laid down. He was concerned he had an hernia now on the right side. He has to do some heavy lifting at work and has not been able to work as well needing ibuprofen. Usually moves his bowels once or twice a day. No problems with urination or defecation. Does not smoke. Rather active.   Other Problems [name], CMA; 05/27/2016 4:13 PM) Inguinal Hernia  Past Surgical History [name], CMA; 05/27/2016 4:13 PM) Knee Surgery Bilateral. Open Inguinal Hernia Surgery Left.  Diagnostic Studies History [name], CMA; 05/27/2016 4:13 PM) Colonoscopy never  Allergies ([name], CMA; 05/27/2016 4:14 PM) No Known Drug Allergies 05/27/2016  Medication History ([name], CMA; 05/27/2016 4:14 PM) Advil (200MG  Capsule, Oral as needed) Active. Medications Reconciled  Social History [name], CMA; 05/27/2016 4:13 PM) Alcohol use Occasional alcohol use. Caffeine use Carbonated beverages, Tea. No drug use Tobacco use Former smoker.  Family History [name], CMA; 05/27/2016 4:13 PM) Heart Disease Father. Ischemic Bowel Disease Father. Melanoma Father.     Review of Systems [name]  Bynum CMA; 05/27/2016 4:13 PM) General Not Present- Appetite Loss, Chills, Fatigue, Fever, Night Sweats, Weight Gain and Weight Loss. Skin Not Present- Change in Wart/Mole, Dryness, Hives, Jaundice, New Lesions, Non-Healing Wounds, Rash and Ulcer. HEENT Present- Wears glasses/contact lenses. Not Present- Earache, Hearing Loss, Hoarseness, Nose Bleed, Oral Ulcers, Ringing in the Ears, Seasonal Allergies, Sinus Pain, Sore Throat, Visual Disturbances and Yellow Eyes. Respiratory Not Present- Bloody sputum, Chronic Cough, Difficulty Breathing, Snoring and Wheezing. Breast Not Present- Breast Mass, Breast Pain, Nipple Discharge and Skin Changes. Cardiovascular Not Present- Chest Pain, Difficulty Breathing Lying Down, Leg Cramps, Palpitations, Rapid Heart Rate, Shortness of Breath and Swelling of Extremities. Gastrointestinal Not Present- Abdominal Pain, Bloating, Bloody Stool, Change in Bowel Habits, Chronic diarrhea, Constipation, Difficulty Swallowing, Excessive gas, Gets full quickly at meals, Hemorrhoids, Indigestion, Nausea, Rectal Pain and Vomiting. Male Genitourinary Not Present- Blood in Urine, Change in Urinary Stream, Frequency, Impotence, Nocturia, Painful Urination, Urgency and Urine Leakage. Musculoskeletal Not Present- Back Pain, Joint Pain, Joint Stiffness, Muscle Pain, Muscle Weakness and Swelling of Extremities. Neurological Not Present- Decreased Memory, Fainting, Headaches, Numbness, Seizures, Tingling, Tremor, Trouble walking and Weakness. Psychiatric Not Present- Anxiety, Bipolar, Change in Sleep Pattern, Depression, Fearful and Frequent crying. Endocrine Not Present- Cold Intolerance, Excessive Hunger, Hair Changes, Heat Intolerance, Hot flashes and New Diabetes. Hematology Not Present- Blood Thinners, Easy Bruising, Excessive bleeding, Gland problems, HIV and Persistent Infections.  Vitals ([name] CMA; 05/27/2016 4:14 PM) 05/27/2016 4:14 PM Weight: 187 lb Height: 67in Body  Surface Area: 1.97 m Body Mass Index: 29.29 kg/m  Temp.: 63F(Temporal)  Pulse: 96 (Regular)  BP: 126/78 (Sitting, Left Arm, Standard)      Physical  Exam [name] MD; 05/27/2016 4:59 PM)  General Mental Status-Alert. General Appearance-Not in acute distress, Not Sickly. Orientation-Oriented X3. Hydration-Well hydrated. Voice-Normal.  Integumentary Global Assessment Upon inspection and palpation of skin surfaces of the - Axillae: non-tender, no inflammation or ulceration, no drainage. and Distribution of scalp and body hair is normal. General Characteristics Temperature - normal warmth is noted.  Head and Neck Head-normocephalic, atraumatic with no lesions or palpable masses. Face Global Assessment - atraumatic, no absence of expression. Neck Global Assessment - no abnormal movements, no bruit auscultated on the right, no bruit auscultated on the left, no decreased range of motion, non-tender. Trachea-midline. Thyroid Gland Characteristics - non-tender.  Eye Eyeball - Left-Extraocular movements intact, No Nystagmus. Eyeball - Right-Extraocular movements intact, No Nystagmus. Cornea - Left-No Hazy. Cornea - Right-No Hazy. Sclera/Conjunctiva - Left-No scleral icterus, No Discharge. Sclera/Conjunctiva - Right-No scleral icterus, No Discharge. Pupil - Left-Direct reaction to light normal. Pupil - Right-Direct reaction to light normal.  ENMT Ears Pinna - Left - no drainage observed, no generalized tenderness observed. Right - no drainage observed, no generalized tenderness observed. Nose and Sinuses External Inspection of the Nose - no destructive lesion observed. Inspection of the nares - Left - quiet respiration. Right - quiet respiration. Mouth and Throat Lips - Upper Lip - no fissures observed, no pallor noted. Lower Lip - no fissures observed, no pallor noted. Nasopharynx - no discharge present. Oral Cavity/Oropharynx -  Tongue - no dryness observed. Oral Mucosa - no cyanosis observed. Hypopharynx - no evidence of airway distress observed.  Chest and Lung Exam Inspection Movements - Normal and Symmetrical. Accessory muscles - No use of accessory muscles in breathing. Palpation Palpation of the chest reveals - Non-tender. Auscultation Breath sounds - Normal and Clear.  Cardiovascular Auscultation Rhythm - Regular. Murmurs & Other Heart Sounds - Auscultation of the heart reveals - No Murmurs and No Systolic Clicks.  Abdomen Inspection Inspection of the abdomen reveals - No Visible peristalsis and No Abnormal pulsations. Umbilicus - No Bleeding, No Urine drainage. Palpation/Percussion Palpation and Percussion of the abdomen reveal - Soft, Non Tender, No Rebound tenderness, No Rigidity (guarding) and No Cutaneous hyperesthesia. Note: Abdomen soft. Nontender, nondistended. No guarding. No umbilical no other hernias. Mild diastases recti.  Male Genitourinary Sexual Maturity Tanner 5 - Adult hair pattern and Adult penile size and shape. Note: Obvious bulging to right scrotum but reducible consistent with moderately large right inguinal hernia. No giant hydrocele or varicocele felt. No recurrent left inguinal hernia. Normal external genitalia. Epididymi, testes, and spermatic cords normal without any masses.  Peripheral Vascular Upper Extremity Inspection - Left - No Cyanotic nailbeds, Not Ischemic. Right - No Cyanotic nailbeds, Not Ischemic.  Neurologic Neurologic evaluation reveals -normal attention span and ability to concentrate, able to name objects and repeat phrases. Appropriate fund of knowledge , normal sensation and normal coordination. Mental Status Affect - not angry, not paranoid. Cranial Nerves-Normal Bilaterally. Gait-Normal.  Neuropsychiatric Mental status exam performed with findings of-able to articulate well with normal speech/language, rate, volume and coherence, thought  content normal with ability to perform basic computations and apply abstract reasoning and no evidence of hallucinations, delusions, obsessions or homicidal/suicidal ideation.  Musculoskeletal Global Assessment Spine, Ribs and Pelvis - no instability, subluxation or laxity. Right Upper Extremity - no instability, subluxation or laxity.  Lymphatic Head & Neck  General Head & Neck Lymphatics: Bilateral - Description - No Localized lymphadenopathy. Axillary  General Axillary Region: Bilateral - Description - No Localized lymphadenopathy.  Femoral & Inguinal  Generalized Femoral & Inguinal Lymphatics: Left - Description - No Localized lymphadenopathy. Right - Description - No Localized lymphadenopathy.    Assessment & Plan [name] MD; 05/27/2016 4:57 PM)  RIGHT INGUINAL HERNIA (K40.90) Impression: Obvious right inguinal hernia going down to the scrotum. Reducible though. No evidence of left inguinal hernia.  I think he would benefit from surgical repair. Reasonable to do a laparoscopic approach. Because its affecting his ability to work, he would like to get it done as soon as possible.  PREOP - ING HERNIA - ENCOUNTER FOR PREOPERATIVE EXAMINATION FOR GENERAL SURGICAL PROCEDURE (Z01.818)  Current Plans You are being scheduled for surgery - Our schedulers will call you.  You should hear from our office's scheduling department within 5 working days about the location, date, and time of surgery. We try to make accommodations for patient's preferences in scheduling surgery, but sometimes the OR schedule or the surgeon's schedule prevents [place] from making those accommodations.  If you have not heard from our office [phone]) in 5 working days, call the office and ask for your surgeon's nurse.  If you have other questions about your diagnosis, plan, or surgery, call the office and ask for your surgeon's nurse.  Written instructions provided The anatomy & physiology of the  abdominal wall and pelvic floor was discussed. The pathophysiology of hernias in the inguinal and pelvic region was discussed. Natural history risks such as progressive enlargement, pain, incarceration, and strangulation was discussed. Contributors to complications such as smoking, obesity, diabetes, prior surgery, etc were discussed.  I feel the risks of no intervention will lead to serious problems that outweigh the operative risks; therefore, I recommended surgery to reduce and repair the hernia. I explained laparoscopic techniques with possible need for an open approach. I noted usual use of mesh to patch and/or buttress hernia repair  Risks such as bleeding, infection, abscess, need for further treatment, heart attack, death, and other risks were discussed. I noted a good likelihood this will help address the problem. Goals of post-operative recovery were discussed as well. Possibility that this will not correct all symptoms was explained. I stressed the importance of low-impact activity, aggressive pain control, avoiding constipation, & not pushing through pain to minimize risk of post-operative chronic pain or injury. Possibility of reherniation was discussed. We will work to minimize complications.  An educational handout further explaining the pathology & treatment options was given as well. Questions were answered. The patient expresses understanding & wishes to proceed with surgery.  Pt Education - Pamphlet Given - Laparoscopic Hernia Repair: discussed with patient and provided information. Pt Education - [facility] Pain Control (Krishana Lutze) Pt Education - [facility] Hernia Post-Op HCI (Caren Garske): discussed with patient and provided information.  [name], M.D., F.A.C.S. Gastrointestinal and Minimally Invasive Surgery [facility] [address] [place], [place] [address] [phone] Main / Paging

## 2016-06-20 NOTE — Anesthesia Procedure Notes (Signed)
Procedure Name: Intubation Date/Time: 06/20/2016 12:43 PM Performed by: Wanita Chamberlain Pre-anesthesia Checklist: Patient identified, Timeout performed, Emergency Drugs available, Suction available and Patient being monitored Patient Re-evaluated:Patient Re-evaluated prior to inductionOxygen Delivery Method: Circle system utilized Preoxygenation: Pre-oxygenation with 100% oxygen Intubation Type: IV induction Ventilation: Mask ventilation without difficulty Laryngoscope Size: Mac and 4 Grade View: Grade I Tube type: Oral Tube size: 7.5 mm Number of attempts: 1 Airway Equipment and Method: Stylet Placement Confirmation: ETT inserted through vocal cords under direct vision,  positive ETCO2 and breath sounds checked- equal and bilateral Secured at: 22 cm Tube secured with: Tape Dental Injury: Teeth and Oropharynx as per pre-operative assessment

## 2016-06-20 NOTE — Discharge Instructions (Signed)
General Anesthesia, Adult, Care After Refer to this sheet in the next few weeks. These instructions provide you with information on caring for yourself after your procedure. Your health care provider may also give you more specific instructions. Your treatment has been planned according to current medical practices, but problems sometimes occur. Call your health care provider if you have any problems or questions after your procedure. WHAT TO EXPECT AFTER THE PROCEDURE After the procedure, it is typical to experience: Sleepiness. Nausea and vomiting. HOME CARE INSTRUCTIONS For the first 24 hours after general anesthesia: Have a responsible person with you. Do not drive a car. If you are alone, do not take public transportation. Do not drink alcohol. Do not take medicine that has not been prescribed by your health care provider. Do not sign important papers or make important decisions. You may resume a normal diet and activities as directed by your health care provider. Change bandages (dressings) as directed. If you have questions or problems that seem related to general anesthesia, call the hospital and ask for the anesthetist or anesthesiologist on call. SEEK MEDICAL CARE IF: You have nausea and vomiting that continue the day after anesthesia. You develop a rash. SEEK IMMEDIATE MEDICAL CARE IF:  You have difficulty breathing. You have chest pain. You have any allergic problems.   This information is not intended to replace advice given to you by your health care provider. Make sure you discuss any questions you have with your health care provider.   Document Released: 11/18/2000 Document Revised: 09/02/2014 Document Reviewed: 12/11/2011 Elsevier Interactive Patient Education 2016 [place]: POST OP INSTRUCTIONS  ######################################################################  EAT Gradually transition to a high fiber diet with a fiber supplement over the  next few weeks after discharge.  Start with a pureed / full liquid diet (see below)  WALK Walk an hour a day.  Control your pain to do that.    CONTROL PAIN Control pain so that you can walk, sleep, tolerate sneezing/coughing, go up/down stairs.  HAVE A BOWEL MOVEMENT DAILY Keep your bowels regular to avoid problems.  OK to try a laxative to override constipation.  OK to use an antidairrheal to slow down diarrhea.  Call if not better after 2 tries  CALL IF YOU HAVE PROBLEMS/CONCERNS Call if you are still struggling despite following these instructions. Call if you have concerns not answered by these instructions  ######################################################################    1. DIET: Follow a light bland diet the first 24 hours after arrival home, such as soup, liquids, crackers, etc.  Be sure to include lots of fluids daily.  Avoid fast food or heavy meals as your are more likely to get nauseated.  Eat a low fat the next few days after surgery. 2. Take your usually prescribed home medications unless otherwise directed. 3. PAIN CONTROL: a. Pain is best controlled by a usual combination of three different methods TOGETHER: i. Ice/Heat ii. Over the counter pain medication iii. Prescription pain medication b. Most patients will experience some swelling and bruising around the hernia(s) such as the bellybutton, groins, or old incisions.  Ice packs or heating pads (30-60 minutes up to 6 times a day) will help. Use ice for the first few days to help decrease swelling and bruising, then switch to heat to help relax tight/sore spots and speed recovery.  Some people prefer to use ice alone, heat alone, alternating between ice & heat.  Experiment to what works for you.  Swelling and bruising can take several  weeks to resolve.   c. It is helpful to take an over-the-counter pain medication regularly for the first few weeks.  Choose one of the following that works best for you: i. Naproxen  (Aleve, etc)  Two 220mg  tabs twice a day ii. Ibuprofen (Advil, etc) Three 200mg  tabs four times a day (every meal & bedtime) iii. Acetaminophen (Tylenol, etc) 325-650mg  four times a day (every meal & bedtime) d. A  prescription for pain medication should be given to you upon discharge.  Take your pain medication as prescribed.  i. If you are having problems/concerns with the prescription medicine (does not control pain, nausea, vomiting, rash, itching, etc), please call us [phone] to see if we need to switch you to a different pain medicine that will work better for you and/or control your side effect better. ii. If you need a refill on your pain medication, please contact your pharmacy.  They will contact our office to request authorization. Prescriptions will not be filled after 5 pm or on week-ends. 4. Avoid getting constipated.  Between the surgery and the pain medications, it is common to experience some constipation.  Increasing fluid intake and taking a fiber supplement (such as Metamucil, Citrucel, FiberCon, MiraLax, etc) 1-2 times a day regularly will usually help prevent this problem from occurring.  A mild laxative (prune juice, Milk of Magnesia, MiraLax, etc) should be taken according to package directions if there are no bowel movements after 48 hours.   5. Wash / shower every day.  You may shower over the dressings as they are waterproof.   6. Remove your waterproof bandages 5 days after surgery.  You may leave the incision open to air.  You may replace a dressing/Band-Aid to cover the incision for comfort if you wish.  Continue to shower over incision(s) after the dressing is off.    7. ACTIVITIES as tolerated:   a. You may resume regular (light) daily activities beginning the next day--such as daily self-care, walking, climbing stairs--gradually increasing activities as tolerated.  If you can walk 30 minutes without difficulty, it is safe to try more intense activity such as  jogging, treadmill, bicycling, low-impact aerobics, swimming, etc. b. Save the most intensive and strenuous activity for last such as sit-ups, heavy lifting, contact sports, etc  Refrain from any heavy lifting or straining until you are off narcotics for pain control.   c. DO NOT PUSH THROUGH PAIN.  Let pain be your guide: If it hurts to do something, don't do it.  Pain is your body warning you to avoid that activity for another week until the pain goes down. d. You may drive when you are no longer taking prescription pain medication, you can comfortably wear a seatbelt, and you can safely maneuver your car and apply brakes. e. Dennis Cohen may have sexual intercourse when it is comfortable.  8. FOLLOW UP in our office a. Please call [facility] at [phone] to set up an appointment to see your surgeon in the office for a follow-up appointment approximately 2-3 weeks after your surgery. b. Make sure that you call for this appointment the day you arrive home to insure a convenient appointment time. 9.  IF YOU HAVE DISABILITY OR FAMILY LEAVE FORMS, BRING THEM TO THE OFFICE FOR PROCESSING.  DO NOT GIVE THEM TO YOUR DOCTOR.  WHEN TO CALL us [phone]: 1. Poor pain control 2. Reactions / problems with new medications (rash/itching, nausea, etc)  3. Fever over 101.5 F (38.5 C)  4. Inability to urinate 5. Nausea and/or vomiting 6. Worsening swelling or bruising 7. Continued bleeding from incision. 8. Increased pain, redness, or drainage from the incision   The clinic staff is available to answer your questions during regular business hours (8:30am-5pm).  Please dont hesitate to call and ask to speak to one of our nurses for clinical concerns.   If you have a medical emergency, go to the nearest emergency room or call 911.  A surgeon from [facility] is always on call at the hospitals in [facility], [place], [place], [place], [place]  [address] ?   [address] [address], [place], [place]   [address] MAIN: [phone] ? TOLL FREE: [phone] ? FAX: [phone] [email or web address]

## 2016-06-21 ENCOUNTER — Encounter (HOSPITAL_COMMUNITY): Payer: Self-pay | Admitting: Surgery

## 2019-12-20 ENCOUNTER — Other Ambulatory Visit: Payer: Self-pay

## 2019-12-20 ENCOUNTER — Ambulatory Visit: Payer: BC Managed Care – PPO | Admitting: Orthopaedic Surgery

## 2019-12-20 ENCOUNTER — Ambulatory Visit: Payer: Self-pay

## 2019-12-20 DIAGNOSIS — M25562 Pain in left knee: Secondary | ICD-10-CM

## 2019-12-20 MED ORDER — LIDOCAINE HCL 1 % IJ SOLN
3.0000 mL | INTRAMUSCULAR | Status: AC | PRN
  Administered 2019-12-20: 3 mL

## 2019-12-20 MED ORDER — METHYLPREDNISOLONE ACETATE 40 MG/ML IJ SUSP
40.0000 mg | INTRAMUSCULAR | Status: AC | PRN
  Administered 2019-12-20: 40 mg via INTRA_ARTICULAR

## 2019-12-20 NOTE — Progress Notes (Signed)
Office Visit Note   Patient: Dennis Cohen           Date of Birth: 11-Aug-1966           MRN: [id number] Visit Date: 12/20/2019              Requested by: No referring provider defined for this encounter. PCP: Patient, No Pcp Per   Assessment & Plan: Visit Diagnoses:  1. Left knee pain, unspecified chronicity     Plan: I did go over his x-rays with him.  I am concerned that there is a large loose body.  I do see a flabella in the back of his knee but there is a separate cortical irregularity in the soft tissues suggesting a large loose body.  Given his mechanical symptoms with locking catching and what he is feeling I do feel it is appropriate to obtain a MRI of his left knee.  I will place a steroid injection in his knee this to help with his acute pain.  He did tolerate this well.  All question concerns were answered and addressed.  We will work on removing the MRI.  Follow-Up Instructions: Return in about 2 weeks (around 01/03/2020).   Orders:  Orders Placed This Encounter  Procedures  . Large Joint Inj  . XR Knee 1-2 Views Left   No orders of the defined types were placed in this encounter.     Procedures: Large Joint Inj: L knee on 12/20/2019 2:02 PM Indications: diagnostic evaluation and pain Details: 22 G 1.5 in needle, superolateral approach  Arthrogram: No  Medications: 3 mL lidocaine 1 %; 40 mg methylPREDNISolone acetate 40 MG/ML Outcome: tolerated well, no immediate complications Procedure, treatment alternatives, risks and benefits explained, specific risks discussed. Consent was given by the patient. Immediately prior to procedure a time out was called to verify the correct patient, procedure, equipment, support staff and site/side marked as required. Patient was prepped and draped in the usual sterile fashion.       Clinical Data: No additional findings.   Subjective: Chief Complaint  Patient presents with  . Left Knee - Pain  The patient is a very  pleasant 54 year old gentleman who comes in with a several week history of worsening left knee pain with locking and catching.  He says it is painful laying in his knee at night he does feel like something is loose and rolling around in his left knee.  He has a remote history of a knee arthroscopy.  He has not had any issues until the last 2 weeks.  Now the knee is swelling somewhat and is getting medial joint line tenderness again with locking catching and mechanical symptoms.  He had not had these before. HPI  Review of Systems .  He currently denies any acute changes in medical status.  He denies any headache, chest pain, shortness of breath, fever, chills, nausea, vomiting  Objective: Vital Signs: There were no vitals taken for this visit.  Physical Exam He is alert and orient x3 and in no acute distress Ortho Exam Examination of his left knee does show significant medial joint line tenderness with a positive Murray sign to the medial compartment of the knee.  I have problems flexing and extending the knee due to discomfort in the posterior medial aspect. Specialty Comments:  No specialty comments available.  Imaging: XR Knee 1-2 Views Left  Result Date: 12/20/2019 2 views of the left knee shows slight medial joint space narrowing.  It does appear there is a large loose body in the posterior medial compartment of the knee.    PMFS History: Patient Active Problem List   Diagnosis Date Noted  . Anxiety state 06/20/2016  . Heartburn   . Bilateral inguinal hernia s/p lap repair w mesh 06/20/2016 05/27/2016  . Hydrocele, left 10/11/2013   Past Medical History:  Diagnosis Date  . Heartburn   . Left hydrocele   . Wears glasses     Family History  Problem Relation Age of Onset  . Hypertension Mother   . High Cholesterol Father   . Heart attack Father   . Healthy Sister   . Healthy Brother     Past Surgical History:  Procedure Laterality Date  . HYDROCELE EXCISION Left  10/11/2013   Procedure: LEFT HYDROCELECTOMY ADULT;  Surgeon: Claybon Jabs, MD;  Location: [facility];  Service: Urology;  Laterality: Left;  . INGUINAL HERNIA REPAIR Left 09/09/2000   Dr [name]  . INGUINAL HERNIA REPAIR Bilateral 06/20/2016   Procedure: LAPAROSCOPIC REPAIR OF BILATERAL INGUINAL HERNIAS;  Surgeon: Michael Boston, MD;  Location: [facility];  Service: General;  Laterality: Bilateral;  . INSERTION OF MESH Bilateral 06/20/2016   Procedure: INSERTION OF MESH;  Surgeon: Michael Boston, MD;  Location: [facility];  Service: General;  Laterality: Bilateral;  . KNEE ARTHROSCOPY W/ MENISCAL REPAIR Bilateral LAST ONE  DEC 2001 (RIGHT)  . TYMPANOSTOMY TUBE PLACEMENT Bilateral AS CHILD   Social History   Occupational History  . Not on file  Tobacco Use  . Smoking status: Never Smoker  . Smokeless tobacco: Never Used  Substance and Sexual Activity  . Alcohol use: No  . Drug use: No  . Sexual activity: Not on file

## 2019-12-21 ENCOUNTER — Other Ambulatory Visit: Payer: Self-pay

## 2019-12-21 DIAGNOSIS — M25562 Pain in left knee: Secondary | ICD-10-CM

## 2020-01-02 ENCOUNTER — Ambulatory Visit (INDEPENDENT_AMBULATORY_CARE_PROVIDER_SITE_OTHER): Payer: BC Managed Care – PPO

## 2020-01-02 ENCOUNTER — Other Ambulatory Visit: Payer: Self-pay

## 2020-01-02 DIAGNOSIS — M25562 Pain in left knee: Secondary | ICD-10-CM | POA: Diagnosis not present

## 2020-01-03 ENCOUNTER — Encounter: Payer: Self-pay | Admitting: Orthopaedic Surgery

## 2020-01-03 ENCOUNTER — Ambulatory Visit: Payer: BC Managed Care – PPO | Admitting: Orthopaedic Surgery

## 2020-01-03 DIAGNOSIS — S83242D Other tear of medial meniscus, current injury, left knee, subsequent encounter: Secondary | ICD-10-CM | POA: Diagnosis not present

## 2020-01-03 NOTE — Progress Notes (Signed)
Dennis Cohen comes in today to go over an MRI of his left knee.  He has been having swelling and pain along the medial joint line with locking catching.  I did place a steroid injection in his knee that did help for a few days.  We did obtain a MRI of his left knee due to the mechanical symptoms.  He is still having the same symptoms.  On exam there is medial joint tenderness and a positive Murray sign the medial compartment of his knee.  He also has pain over the medial femoral condyle.  The MRI is reviewed with him and I went over the actual study.  He does have a complex posterior horn mid body medial meniscal tear.  There is only some thinning of the articular cartilage in the medial compartment of his knee with no full-thickness cartilage deficits.  This seems to be more of an acute tear.  There is an osteochondral fragment in the posterior aspect of his knee as well as a thickened medial plica which is causing him symptoms.  The ACL and PCL as well as collateral ligaments are intact.  The lateral compartment is normal.  Given the MRI findings and his clinical exam findings we are recommending an arthroscopic intervention of his left knee.  I explained what the surgery involves.  I showed him a knee model and went over the MRI and gave him a copy of the report.  With surgery we would need him to be out of work anywhere from 1 to 4 weeks depending on his postoperative recovery.  I did talk about his interoperative and postoperative course and what to expect with surgery as well as a discussion of the risk and benefits.  I gave him a note that will allow him to work wearing his knee brace for now without any restrictions until we set him up for surgery.  All questions and concerns were answered and addressed.

## 2020-02-08 ENCOUNTER — Encounter (HOSPITAL_COMMUNITY): Payer: Self-pay

## 2020-02-08 ENCOUNTER — Ambulatory Visit (HOSPITAL_COMMUNITY)
Admission: EM | Admit: 2020-02-08 | Discharge: 2020-02-08 | Disposition: A | Discharge status: Home / Self Care | Payer: BC Managed Care – PPO

## 2020-02-08 ENCOUNTER — Other Ambulatory Visit: Payer: Self-pay

## 2020-02-08 DIAGNOSIS — R21 Rash and other nonspecific skin eruption: Secondary | ICD-10-CM | POA: Diagnosis not present

## 2020-02-08 MED ORDER — HYDROXYZINE HCL 25 MG PO TABS: 25.0000 mg | ORAL_TABLET | Freq: Four times a day (QID) | ORAL | 0 refills | Status: AC

## 2020-02-08 MED ORDER — PREDNISONE 10 MG (21) PO TBPK: ORAL_TABLET | ORAL | 0 refills | Status: AC

## 2020-02-08 NOTE — ED Triage Notes (Signed)
Pt reports he started having a rash in the left leg 3 days ago and now is all over his body. Cortisone cream helps with the itchiness.

## 2020-02-08 NOTE — ED Provider Notes (Addendum)
[place]    CSN: [id number] Arrival date & time: 02/08/20  1732      History   Chief Complaint Chief Complaint  Patient presents with  . Rash    HPI SHASHWAT CLEARY is a 54 y.o. male.   Patient presents with itchy rash.  He reports rash started on his left knee area on Saturday but is since spread to most portions of his body.  He reports it is not on the palms of his hand, face, scalp or bottoms of feet.  Reports has been itchy all time.  Reports he has tried cortisone cream on it with some help.  He reports he has not had any new exposures to soaps or lotions.  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  He denies any fevers, chills, cough, runny nose, sore throat.  Does not know any insect bites.  No known allergies.  No new medicines, foods, supplements. No one with similar rash near him. He has never had this before.      Past Medical History:  Diagnosis Date  . Heartburn   . Left hydrocele   . Wears glasses     Patient Active Problem List   Diagnosis Date Noted  . Acute medial meniscus tear, left, subsequent encounter 01/03/2020  . Anxiety state 06/20/2016  . Heartburn   . Bilateral inguinal hernia s/p lap repair w mesh 06/20/2016 05/27/2016  . Hydrocele, left 10/11/2013    Past Surgical History:  Procedure Laterality Date  . HYDROCELE EXCISION Left 10/11/2013   Procedure: LEFT HYDROCELECTOMY ADULT;  Surgeon: Claybon Jabs, MD;  Location: [facility];  Service: Urology;  Laterality: Left;  . INGUINAL HERNIA REPAIR Left 09/09/2000   Dr [name]  . INGUINAL HERNIA REPAIR Bilateral 06/20/2016   Procedure: LAPAROSCOPIC REPAIR OF BILATERAL INGUINAL HERNIAS;  Surgeon: Michael Boston, MD;  Location: [facility];  Service: General;  Laterality: Bilateral;  . INSERTION OF MESH Bilateral 06/20/2016   Procedure:  INSERTION OF MESH;  Surgeon: Michael Boston, MD;  Location: [facility];  Service: General;  Laterality: Bilateral;  . KNEE ARTHROSCOPY W/ MENISCAL REPAIR Bilateral LAST ONE  DEC 2001 (RIGHT)  . TYMPANOSTOMY TUBE PLACEMENT Bilateral AS CHILD       Home Medications    Prior to Admission medications   Medication Sig Start Date End Date Taking? Authorizing Provider  hydrocortisone cream 1 % Apply 1 application topically 2 (two) times daily.   Yes [provider]  acetaminophen (TYLENOL) 500 MG tablet Take 500 mg by mouth every 6 (six) hours as needed.    [provider]  hydrOXYzine (ATARAX/VISTARIL) 25 MG tablet Take 1 tablet (25 mg total) by mouth every 6 (six) hours. 02/08/20   Evelise Reine, Marguerita Beards, PA-C  ibuprofen (ADVIL,MOTRIN) 200 MG tablet Take 400 mg by mouth every 6 (six) hours as needed (for pain.).     [provider]  predniSONE (STERAPRED UNI-PAK 21 TAB) 10 MG (21) TBPK tablet Take 6 tablets (60 mg total) by mouth daily for 3 days, THEN 4 tablets (40 mg total) daily for 3 days, THEN 2 tablets (20 mg total) daily for 6 days. 02/08/20 02/20/20  Josanne Boerema, Marguerita Beards, PA-C  traMADol (ULTRAM) 50 MG tablet Take 1-2 tablets (50-100 mg total) by mouth every 6 (six) hours as needed for  moderate pain or severe pain. 06/20/16   Michael Boston, MD    Family History Family History  Problem Relation Age of Onset  . Hypertension Mother   . High Cholesterol Father   . Heart attack Father   . Healthy Sister   . Healthy Brother     Social History Social History   Tobacco Use  . Smoking status: Never Smoker  . Smokeless tobacco: Never Used  Substance Use Topics  . Alcohol use: No  . Drug use: No     Allergies   Patient has no known allergies.   Review of Systems Review of Systems   Physical Exam Triage Vital Signs ED Triage Vitals  Enc Vitals Group     BP 02/08/20 1753 (!) 137/93     Pulse Rate 02/08/20 1753 86     Resp 02/08/20 1753 18     Temp 02/08/20 1753 98 F  (36.7 C)     Temp Source 02/08/20 1753 Oral     SpO2 02/08/20 1753 98 %     Weight --      Height --      Head Circumference --      Peak Flow --      Pain Score 02/08/20 1751 0     Pain Loc --      Pain Edu? --      Excl. in [place]? --    No data found.  Updated Vital Signs BP (!) 137/93 (BP Location: Left Arm)   Pulse 86   Temp 98 F (36.7 C) (Oral)   Resp 18   SpO2 98%   Visual Acuity Right Eye Distance:   Left Eye Distance:   Bilateral Distance:    Right Eye Near:   Left Eye Near:    Bilateral Near:     Physical Exam Cardiovascular:     Rate and Rhythm: Normal rate.  Pulmonary:     Effort: Pulmonary effort is normal. No respiratory distress.  Skin:    Comments: Diffuse papular erythematous blanching rash.  There is some dermatographia.  Color station in some areas.  Covers much of the extremities, trunk with anterior and posterior.  Sparing the face and scalp.  Also spares the palms of hands and soles of feet.  No pustules or vesicles.  Neurological:     Mental Status: He is alert.            UC Treatments / Results  Labs (all labs ordered are listed, but only abnormal results are displayed) Labs Reviewed - No data to display  EKG   Radiology No results found.  Procedures Procedures (including critical care time)  Medications Ordered in UC Medications - No data to display  Initial Impression / Assessment and Plan / UC Course  I have reviewed the triage vital signs and the nursing notes.  Pertinent labs & imaging results that were available during my care of the patient were reviewed by me and considered in my medical decision making (see chart for details).     #Rash Patient 54 year old presenting with likely allergic contact dermatitis.  Possible exposure with change in detergent.  We will do 3 days of 60 mg prednisone, 3 days 40 and 6 days a 20.  Hydroxyzine as needed.  Recommended Zyrtec during the day.  Return precautions were discussed.   Patient verbalized understanding Final Clinical Impressions(s) / UC Diagnoses   Final diagnoses:  Rash and nonspecific skin eruption     Discharge Instructions  Take the prednisone as prescribed  Take hydroxyzine as needed for continued itch, this may make you sleepy so use at night  You may take over the counter zyrtec during the day  Return if not improving over the next 3 days      ED Prescriptions    Medication Sig Dispense Auth. Provider   predniSONE (STERAPRED UNI-PAK 21 TAB) 10 MG (21) TBPK tablet Take 6 tablets (60 mg total) by mouth daily for 3 days, THEN 4 tablets (40 mg total) daily for 3 days, THEN 2 tablets (20 mg total) daily for 6 days. 42 tablet Mccade Sullenberger, Marguerita Beards, PA-C   hydrOXYzine (ATARAX/VISTARIL) 25 MG tablet Take 1 tablet (25 mg total) by mouth every 6 (six) hours. 12 tablet Darvin Dials, Marguerita Beards, PA-C     PDMP not reviewed this encounter.   Purnell Shoemaker, PA-C 02/08/20 1834    Lurae Hornbrook, Marguerita Beards, PA-C 02/08/20 2349

## 2020-02-08 NOTE — Discharge Instructions (Signed)
Take the prednisone as prescribed  Take hydroxyzine as needed for continued itch, this may make you sleepy so use at night  You may take over the counter zyrtec during the day  Return if not improving over the next 3 days

## 2020-03-30 ENCOUNTER — Encounter: Payer: Self-pay | Admitting: Orthopaedic Surgery

## 2020-03-30 ENCOUNTER — Other Ambulatory Visit: Payer: Self-pay | Admitting: Orthopaedic Surgery

## 2020-03-30 DIAGNOSIS — S83232D Complex tear of medial meniscus, current injury, left knee, subsequent encounter: Secondary | ICD-10-CM | POA: Diagnosis not present

## 2020-03-30 MED ORDER — HYDROCODONE-ACETAMINOPHEN 5-325 MG PO TABS: 1.0000 | ORAL_TABLET | Freq: Four times a day (QID) | ORAL | 0 refills | Status: AC | PRN

## 2020-04-10 ENCOUNTER — Ambulatory Visit (INDEPENDENT_AMBULATORY_CARE_PROVIDER_SITE_OTHER): Payer: BC Managed Care – PPO | Admitting: Orthopaedic Surgery

## 2020-04-10 ENCOUNTER — Encounter: Payer: Self-pay | Admitting: Orthopaedic Surgery

## 2020-04-10 DIAGNOSIS — S83242D Other tear of medial meniscus, current injury, left knee, subsequent encounter: Secondary | ICD-10-CM

## 2020-04-10 DIAGNOSIS — Z9889 Other specified postprocedural states: Secondary | ICD-10-CM | POA: Insufficient documentation

## 2020-04-10 NOTE — Progress Notes (Signed)
The patient comes in today just several weeks status post a left knee arthroscopy.  We found a significant medial meniscal tear from posterior horn of the mid body.  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  He does report a significant effusion with his knee today he is walking with crutches.  On exam he does have a large knee joint effusion of his left knee.  I was able to aspirate about 70 cc of fluid from the knee the gave immediate relief.  His calf is soft.  I did place a steroid injection in his knee which he tolerated well.  He will continue slowly increase his activities and I will see him back in just 2 weeks to see how he is doing overall.  All questions and concerns were answered and addressed.  He reports he is only taking Tylenol and ibuprofen for pain.

## 2020-04-24 ENCOUNTER — Encounter: Payer: Self-pay | Admitting: Orthopaedic Surgery

## 2020-04-24 ENCOUNTER — Ambulatory Visit (INDEPENDENT_AMBULATORY_CARE_PROVIDER_SITE_OTHER): Payer: BC Managed Care – PPO | Admitting: Orthopaedic Surgery

## 2020-04-24 DIAGNOSIS — Z9889 Other specified postprocedural states: Secondary | ICD-10-CM

## 2020-04-24 DIAGNOSIS — S83242D Other tear of medial meniscus, current injury, left knee, subsequent encounter: Secondary | ICD-10-CM

## 2020-04-24 NOTE — Progress Notes (Signed)
Dennis Cohen comes in today status post a left knee arthroscopy.  This was performed 3 and half weeks ago.  At his first postoperative visit I did drain a large effusion from his knee and place a steroid in the knee.  He is doing much better overall and denies any swelling.  He does report appropriate pain with pivoting activities to the medial aspect of his left knee.  He did have a significant medial meniscal tear.  On examination of his left knee today, there is no effusion and minimal swelling.  The range of motion is full.  He does have appropriate tenderness on the medial compartment of his knee.  He understands that should slowly improve with time.  He does have a risk of developing arthritis over time and that knee given the extent of his meniscal tear.  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  If the knee swells again he will let us know.  I can release him to full work activities starting September 7.  I gave him a note reflecting this.  All questions concerns were answered and addressed.

## 2020-06-19 ENCOUNTER — Ambulatory Visit (INDEPENDENT_AMBULATORY_CARE_PROVIDER_SITE_OTHER): Payer: BC Managed Care – PPO | Admitting: Orthopaedic Surgery

## 2020-06-19 ENCOUNTER — Encounter: Payer: Self-pay | Admitting: Orthopaedic Surgery

## 2020-06-19 DIAGNOSIS — Z9889 Other specified postprocedural states: Secondary | ICD-10-CM

## 2020-06-19 DIAGNOSIS — M25562 Pain in left knee: Secondary | ICD-10-CM | POA: Diagnosis not present

## 2020-06-19 MED ORDER — LIDOCAINE HCL 1 % IJ SOLN
3.0000 mL | INTRAMUSCULAR | Status: AC | PRN
  Administered 2020-06-19: 3 mL

## 2020-06-19 NOTE — Progress Notes (Signed)
   Procedure Note  Patient: Dennis Cohen             Date of Birth: [birth year]           MRN: [id number]             Visit Date: 06/19/2020 HPI: Dennis Cohen returns today follow-up of his left knee status post left knee arthroscopy 03/30/2020 for a meniscal tear he was also found to have grade III chondromalacia involving the trochlear groove.  He states the knee is hurting just as bad as it was prior to surgery.  Said no new injury does feel like the knee may give way on him.  Did undergo a left knee aspiration and cortisone injection 04/10/2020 which helped initially.  Has been doing the exercises for strengthening the knee.  Physical exam: Left knee feels full extension and flexion to 115 120 degrees.  No abnormal warmth erythema.  Positive effusion.  Procedures: Visit Diagnoses:  1. Status post arthroscopy of left knee     Large Joint Inj: L knee on 06/19/2020 4:45 PM Indications: pain Details: 22 G 1.5 in needle, superolateral approach  Arthrogram: No  Medications: 3 mL lidocaine 1 % Aspirate: 30 mL yellow and blood-tinged Outcome: tolerated well, no immediate complications Procedure, treatment alternatives, risks and benefits explained, specific risks discussed. Consent was given by the patient. Immediately prior to procedure a time out was called to verify the correct patient, procedure, equipment, support staff and site/side marked as required. Patient was prepped and draped in the usual sterile fashion.     Plan: Patient tolerated the aspiration well he will leave the Ace bandage in place, which was applied today, until this evening.  Patient on quad strengthening exercises.  We will see him back in 1 month sooner if he needs repeat aspiration.

## 2020-07-18 IMAGING — MR MR KNEE*L* W/O CM
7 series · 40 of 40 positions shown · non-contrast
Comparison: Radiographs from 12/20/2019

CLINICAL DATA: Knee instability, suspected free osteochondral
fragments based on radiography.

EXAM:
MRI OF THE LEFT KNEE WITHOUT CONTRAST
TECHNIQUE: Multiplanar, multisequence MR imaging of the knee was performed. No
intravenous contrast was administered.

[Series 3: T2 fat-sat · axial · 4.0mm · 0.53mm/px · z∈[-88,+82]mm · 6 of 35 slices shown (1 of 3)]
[im 1/35]
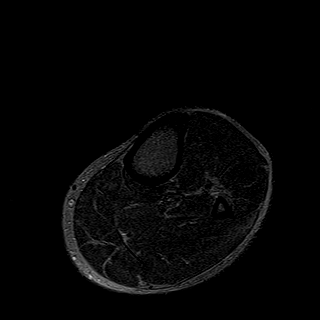
[im 7/35]
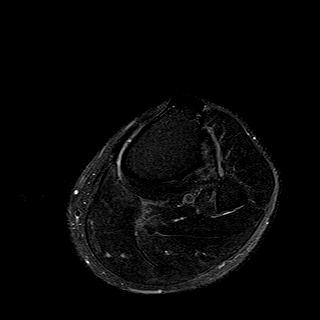
[im 14/35]
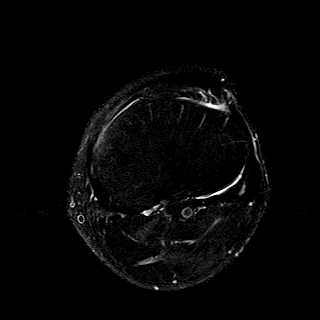
[im 21/35]
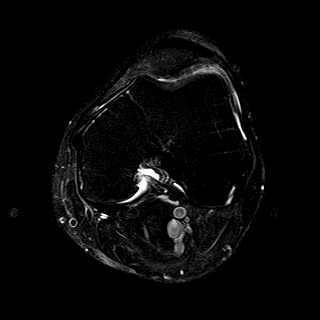
[im 28/35]
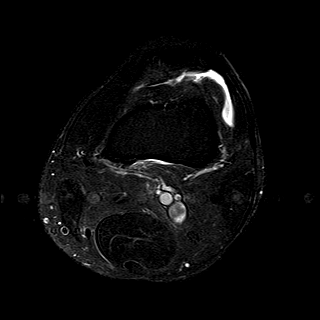
[im 35/35]
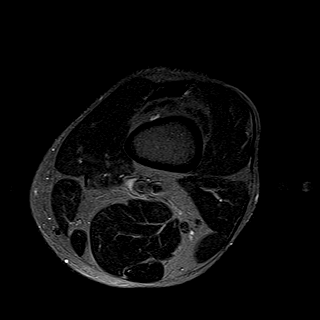

[Series 4: T1 · coronal · 4.0mm · 0.62mm/px · 6 of 31 slices shown]
[im 1/31]
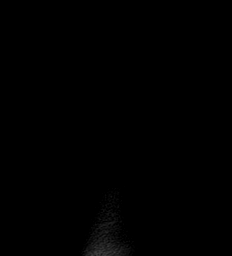
[im 7/31]
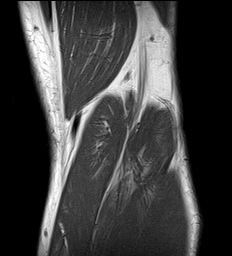
[im 13/31]
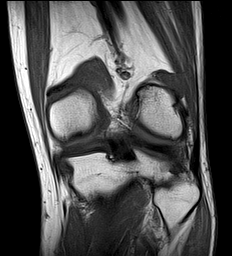
[im 19/31]
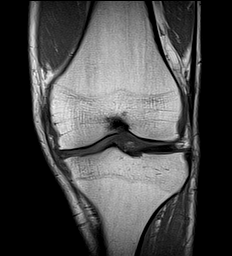
[im 25/31]
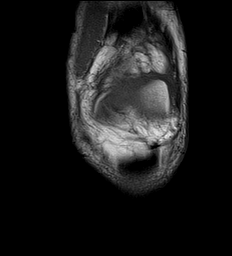
[im 31/31]
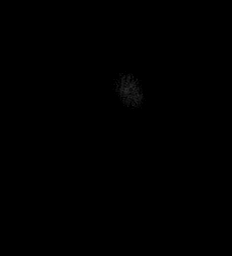

[Series 5: PD fat-sat · sagittal · 3.0mm · 0.62mm/px · 6 of 30 slices shown (1 of 3)]
[im 1/30]
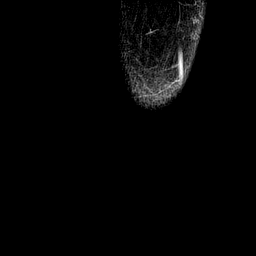
[im 6/30]
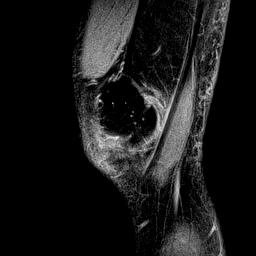
[im 12/30]
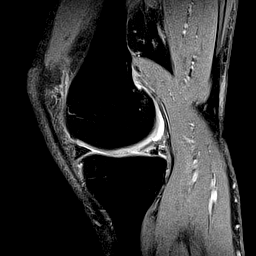
[im 18/30]
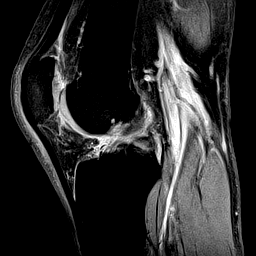
[im 24/30]
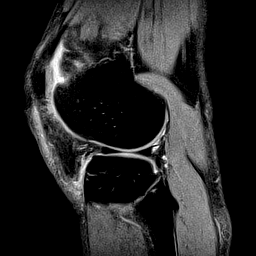
[im 30/30]
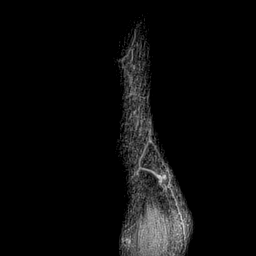

[Series 6: T2 fat-sat · coronal · 4.0mm · 0.62mm/px · 6 of 31 slices shown (2 of 3)]
[im 1/31]
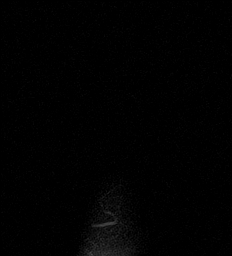
[im 7/31]
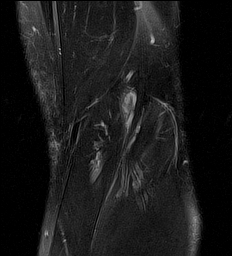
[im 13/31]
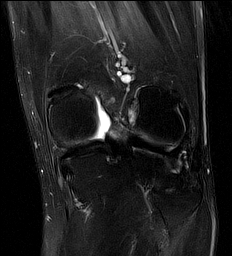
[im 19/31]
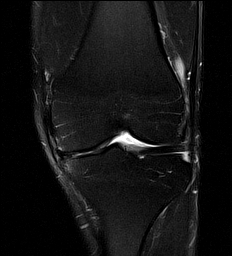
[im 25/31]
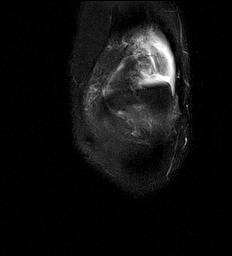
[im 31/31]
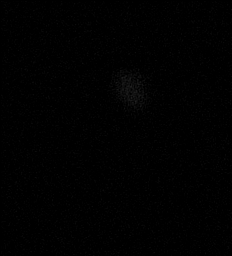

[Series 7: PD fat-sat · oblique · 2.0mm · 0.50mm/px · 4 of 19 slices shown (2 of 3)]
[im 1/19]
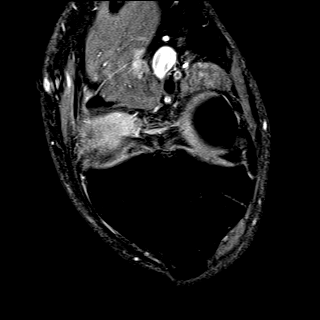
[im 7/19]
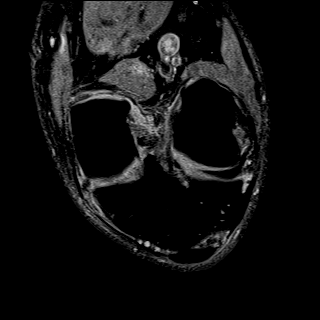
[im 13/19]
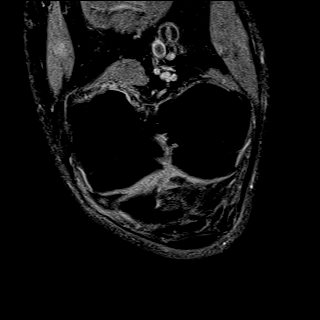
[im 19/19]
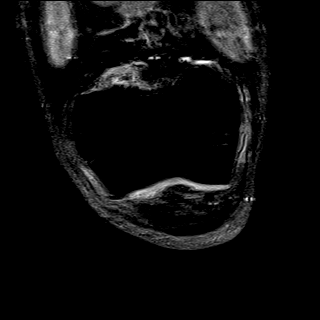

[Series 8: PD fat-sat · coronal · 4.0mm · 0.62mm/px · 6 of 31 slices shown (3 of 3)]
[im 1/31]
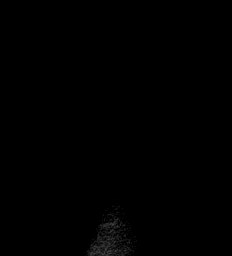
[im 7/31]
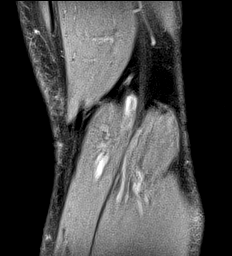
[im 13/31]
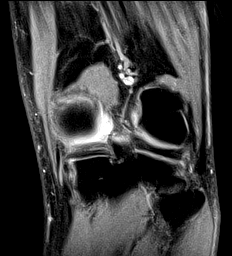
[im 19/31]
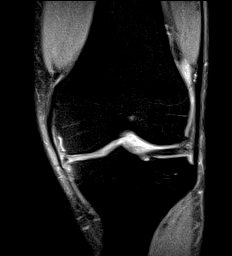
[im 25/31]
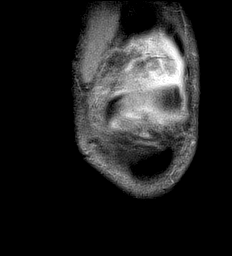
[im 31/31]
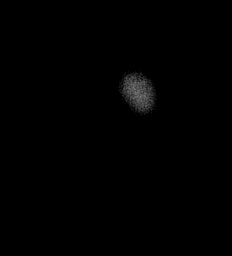

[Series 10: T2 fat-sat · sagittal · 3.0mm · 0.62mm/px · 6 of 30 slices shown (3 of 3)]
[im 1/30]
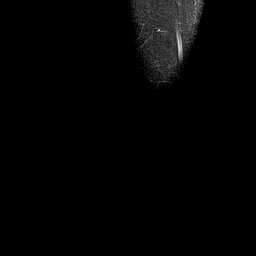
[im 6/30]
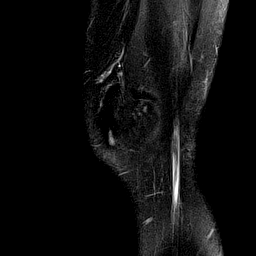
[im 12/30]
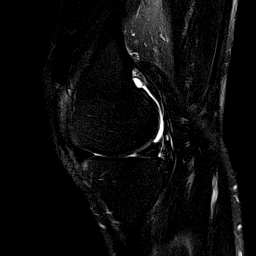
[im 18/30]
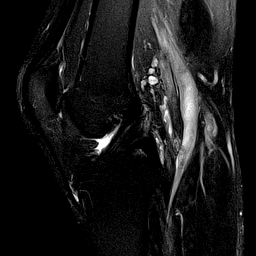
[im 24/30]
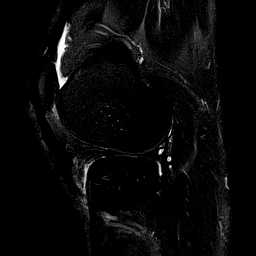
[im 30/30]
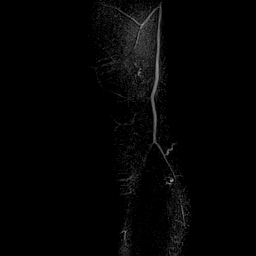

[40 of 40 positions shown; findings below may reference images not displayed]

FINDINGS: MENISCI

Medial meniscus: Complex tear including oblique inferior surface
component in the posterior horn and radial component in the midbody.

Lateral meniscus:  Unremarkable

LIGAMENTS

Cruciates:  Unremarkable

Collaterals:  Unremarkable

CARTILAGE

Patellofemoral: Mild patellar chondral heterogeneity favoring mild
chondromalacia.

Medial:  Mild to moderate degenerative chondral thinning.

Lateral:  Unremarkable

Joint:  Small knee effusion. Thickened medial plica.

0.7 by 0.3 by 0.4 cm free osteochondral fragment is noted in the
joint posterior to the posterior horn medial meniscus on image [DATE].

Popliteal Fossa:  Small Baker's cyst.

Extensor Mechanism:  Mild prepatellar subcutaneous edema.

Bones: No significant extra-articular osseous abnormalities
identified.

Other: No supplemental non-categorized findings.
IMPRESSION: 1. Complex tear of the posterior horn and midbody medial meniscus.
2. 7 mm in long axis free osteochondral fragment posterior to the
posterior horn medial meniscus.
3. Small knee effusion with thickened medial plica. Small Baker's
cyst.
4. Mild to moderate degenerative chondral thinning in the medial
compartment. Mild patellar chondromalacia.

## 2020-07-24 ENCOUNTER — Ambulatory Visit: Payer: BC Managed Care – PPO | Admitting: Orthopaedic Surgery

## 2020-07-31 ENCOUNTER — Encounter: Payer: Self-pay | Admitting: Orthopaedic Surgery

## 2020-07-31 ENCOUNTER — Ambulatory Visit: Payer: BC Managed Care – PPO | Admitting: Orthopaedic Surgery

## 2020-07-31 DIAGNOSIS — Z9889 Other specified postprocedural states: Secondary | ICD-10-CM

## 2020-07-31 DIAGNOSIS — S83242D Other tear of medial meniscus, current injury, left knee, subsequent encounter: Secondary | ICD-10-CM

## 2020-07-31 NOTE — Progress Notes (Signed)
Pavel is now 4 months out from a left knee arthroscopy.  He had quite a significant and complex medial meniscal tear with intact cartilage in the medial lateral compartments of his knee.  His ACL and lateral meniscus were also intact as was his PCL.  There was significant thinning of the cartilage in the trochlear groove but otherwise the rest of his knee looks good.  The few weeks after surgery we did aspirate fluid from the knee and place a steroid injection in his knee.  In October we aspirated more fluid from the knee.  He says he has some days where the knee does not hurt at all but other days where it gives him a hard time but he is on his legs and knees all day at work.  Examination of his left knee today shows only a mild effusion.  He does have good range of motion of the knee and it feels like it is getting better today.  I think it is appropriate that he just give this time in terms of strengthening his knee and seeing if the swelling will eventually subside.  Certainly I would be happy to see him back in the next 6 to 8 weeks to aspirate more fluid from the knee if needed and place a steroid injection in his knee.  If we do not need to do this I would also then recommend hyaluronic acid for the knee.  He agrees with the treatment plan.  If it worsens in any way he will let us know.

## 2020-10-23 ENCOUNTER — Ambulatory Visit: Payer: Self-pay

## 2020-10-23 ENCOUNTER — Ambulatory Visit (INDEPENDENT_AMBULATORY_CARE_PROVIDER_SITE_OTHER): Payer: BC Managed Care – PPO | Admitting: Orthopaedic Surgery

## 2020-10-23 DIAGNOSIS — M25561 Pain in right knee: Secondary | ICD-10-CM | POA: Diagnosis not present

## 2020-10-23 DIAGNOSIS — M25461 Effusion, right knee: Secondary | ICD-10-CM

## 2020-10-23 MED ORDER — LIDOCAINE HCL 1 % IJ SOLN
3.0000 mL | INTRAMUSCULAR | Status: AC | PRN
  Administered 2020-10-23: 3 mL

## 2020-10-23 MED ORDER — METHYLPREDNISOLONE ACETATE 40 MG/ML IJ SUSP
40.0000 mg | INTRAMUSCULAR | Status: AC | PRN
  Administered 2020-10-23: 40 mg via INTRA_ARTICULAR

## 2020-10-23 NOTE — Progress Notes (Signed)
Office Visit Note   Patient: Dennis Cohen           Date of Birth: 1965/12/09           MRN: [id number] Visit Date: 10/23/2020              Requested by: No referring provider defined for this encounter. PCP: Patient, No Pcp Per   Assessment & Plan: Visit Diagnoses:  1. Acute pain of right knee   2. Effusion, right knee     Plan: I did aspirate easily 30 cc of clear fluid from the knee on the right side.  I placed a steroid injection in his knee after that.  He felt much better after this.  This is the knee that I would definitely recommend a MRI of his right knee to rule out a meniscal tear or other internal derangement if this reoccurs.  He will let us know.  Follow-Up Instructions: Return if symptoms worsen or fail to improve.   Orders:  Orders Placed This Encounter  Procedures  . Large Joint Inj  . XR Knee 1-2 Views Right   No orders of the defined types were placed in this encounter.     Procedures: Large Joint Inj: R knee on 10/23/2020 4:22 PM Indications: diagnostic evaluation and pain Details: 22 G 1.5 in needle, superolateral approach  Arthrogram: No  Medications: 3 mL lidocaine 1 %; 40 mg methylPREDNISolone acetate 40 MG/ML Outcome: tolerated well, no immediate complications Procedure, treatment alternatives, risks and benefits explained, specific risks discussed. Consent was given by the patient. Immediately prior to procedure a time out was called to verify the correct patient, procedure, equipment, support staff and site/side marked as required. Patient was prepped and draped in the usual sterile fashion.       Clinical Data: No additional findings.   Subjective: Chief Complaint  Patient presents with  . Right Knee - Pain  The patient is a 55 year old gentleman well-known to me.  We have asked him for arthroscopic surgery last year on his left knee and that left knee is doing well.  About a week ago he developed swelling in his right knee with no  known injury.  He does not have a history of gout.  It has been uncomfortable walking.  It was a lot more swollen last week than it is now but is still significantly swollen.  HPI  Review of Systems There is currently listed no headache, chest pain, shortness of breath, fever, chills, nausea, vomiting  Objective: Vital Signs: There were no vitals taken for this visit.  Physical Exam He is alert and orient x3 and in no acute distress Ortho Exam Examination of his right knee shows a moderate effusion with good range of motion and no redness.  The knee is ligamentously stable. Specialty Comments:  No specialty comments available.  Imaging: XR Knee 1-2 Views Right  Result Date: 10/23/2020 2 views of the right knee shows no acute findings.  The joint space is well-maintained.    PMFS History: Patient Active Problem List   Diagnosis Date Noted  . Status post arthroscopy of left knee 04/10/2020  . Acute medial meniscus tear, left, subsequent encounter 01/03/2020  . Anxiety state 06/20/2016  . Heartburn   . Bilateral inguinal hernia s/p lap repair w mesh 06/20/2016 05/27/2016  . Hydrocele, left 10/11/2013   Past Medical History:  Diagnosis Date  . Heartburn   . Left hydrocele   . Wears glasses  Family History  Problem Relation Age of Onset  . Hypertension Mother   . High Cholesterol Father   . Heart attack Father   . Healthy Sister   . Healthy Brother     Past Surgical History:  Procedure Laterality Date  . HYDROCELE EXCISION Left 10/11/2013   Procedure: LEFT HYDROCELECTOMY ADULT;  Surgeon: Claybon Jabs, MD;  Location: [facility];  Service: Urology;  Laterality: Left;  . INGUINAL HERNIA REPAIR Left 09/09/2000   Dr [name]  . INGUINAL HERNIA REPAIR Bilateral 06/20/2016   Procedure: LAPAROSCOPIC REPAIR OF BILATERAL INGUINAL HERNIAS;  Surgeon: Michael Boston, MD;  Location: [facility];  Service: General;  Laterality: Bilateral;  . INSERTION OF MESH Bilateral  06/20/2016   Procedure: INSERTION OF MESH;  Surgeon: Michael Boston, MD;  Location: [facility];  Service: General;  Laterality: Bilateral;  . KNEE ARTHROSCOPY W/ MENISCAL REPAIR Bilateral LAST ONE  [place] 2001 (RIGHT)  . TYMPANOSTOMY TUBE PLACEMENT Bilateral AS CHILD   Social History   Occupational History  . Not on file  Tobacco Use  . Smoking status: Never Smoker  . Smokeless tobacco: Never Used  Substance and Sexual Activity  . Alcohol use: No  . Drug use: No  . Sexual activity: Not on file

## 2021-01-15 ENCOUNTER — Other Ambulatory Visit: Payer: Self-pay

## 2021-01-15 ENCOUNTER — Telehealth: Payer: Self-pay | Admitting: Orthopedic Surgery

## 2021-01-15 DIAGNOSIS — M25461 Effusion, right knee: Secondary | ICD-10-CM

## 2021-01-15 NOTE — Telephone Encounter (Signed)
Dennis Cohen states that his right knee is "killing him" and he is requesting an MRI. Can we get this ordered for him?

## 2021-01-15 NOTE — Telephone Encounter (Signed)
It is definitely appropriate to order an MRI of his right knee at this standpoint to rule out a meniscal tear.

## 2021-01-28 ENCOUNTER — Other Ambulatory Visit: Payer: Self-pay

## 2021-01-28 ENCOUNTER — Ambulatory Visit
Admission: RE | Admit: 2021-01-28 | Discharge: 2021-01-28 | Disposition: A | Discharge status: Home / Self Care | Payer: BC Managed Care – PPO | Source: Ambulatory Visit | Attending: Orthopaedic Surgery | Admitting: Orthopaedic Surgery

## 2021-01-28 DIAGNOSIS — M25461 Effusion, right knee: Secondary | ICD-10-CM

## 2021-02-05 ENCOUNTER — Ambulatory Visit: Payer: BC Managed Care – PPO | Admitting: Orthopaedic Surgery

## 2021-02-05 ENCOUNTER — Encounter: Payer: Self-pay | Admitting: Orthopaedic Surgery

## 2021-02-05 DIAGNOSIS — S83231D Complex tear of medial meniscus, current injury, right knee, subsequent encounter: Secondary | ICD-10-CM

## 2021-02-05 NOTE — Progress Notes (Signed)
Dennis Cohen comes in today to go over the MRI of his right knee.  He was having significant recurrent effusions of the right knee as well as medial joint line tenderness with locking and catching.  His plain films were normal.  When he went for his MRI this past Sunday he had a large effusion again.  He does still show moderate effusion of his right knee today with medial joint line tenderness.  The MRI of his knee is reviewed and does show a moderate to significant effusion of the right knee.  There is also a complex tear of the medial meniscus of the right knee.  There is significant thinning of the articular cartilage in the medial compartment of his knee which is surprising given his plain film findings.  I did aspirate 25 cc of fluid off of his right knee.  He is a thin individual so this was a lot for his knee.  I placed a steroid injection at today as well.  I have recommended at least arthroscopic intervention of his right knee with a partial medial meniscectomy.  At some point he may end up benefiting from a knee replacement given a small area of full-thickness cartilage loss on that medial aspect of his knee.  However, he has done very well with arthroscopic interventions on the left knee in the past and given the fact that he is standing highly active as well as based on the MRI findings I would still recommend an arthroscopic intervention first and he understands this as well.  He said he will let us know.

## 2021-04-30 ENCOUNTER — Ambulatory Visit
Admission: EM | Admit: 2021-04-30 | Discharge: 2021-04-30 | Disposition: A | Discharge status: Home / Self Care | Payer: BC Managed Care – PPO | Attending: Internal Medicine | Admitting: Internal Medicine

## 2021-04-30 ENCOUNTER — Encounter: Payer: Self-pay | Admitting: Emergency Medicine

## 2021-04-30 ENCOUNTER — Other Ambulatory Visit: Payer: Self-pay

## 2021-04-30 DIAGNOSIS — L03116 Cellulitis of left lower limb: Secondary | ICD-10-CM | POA: Diagnosis not present

## 2021-04-30 DIAGNOSIS — W57XXXA Bitten or stung by nonvenomous insect and other nonvenomous arthropods, initial encounter: Secondary | ICD-10-CM

## 2021-04-30 DIAGNOSIS — S90562A Insect bite (nonvenomous), left ankle, initial encounter: Secondary | ICD-10-CM | POA: Diagnosis not present

## 2021-04-30 DIAGNOSIS — R2242 Localized swelling, mass and lump, left lower limb: Secondary | ICD-10-CM

## 2021-04-30 DIAGNOSIS — M79605 Pain in left leg: Secondary | ICD-10-CM | POA: Diagnosis not present

## 2021-04-30 MED ORDER — DOXYCYCLINE HYCLATE 100 MG PO CAPS: 100.0000 mg | ORAL_CAPSULE | Freq: Two times a day (BID) | ORAL | 0 refills | Status: DC

## 2021-04-30 MED ORDER — CETIRIZINE HCL 10 MG PO TABS: 10.0000 mg | ORAL_TABLET | Freq: Every day | ORAL | 0 refills | Status: AC

## 2021-04-30 NOTE — Discharge Instructions (Addendum)
You have been prescribed cetirizine antihistamine and doxycycline antibiotic to treat your left lower leg swelling and infection.  Please also use cool compresses to help with comfort and decreasing inflammation.  Please do not take hydroxyzine and cetirizine at the same time as this may cause drowsiness.

## 2021-04-30 NOTE — ED Triage Notes (Signed)
Bug bite to left ankle for an unknown amount of time. Now swollen, with visible swelling extending into lower leg, oozing clear liquid, red, warm to touch. Has been putting hydrocortisone cream on it without relief. Pain increased with walking

## 2021-04-30 NOTE — ED Provider Notes (Signed)
[facility]    CSN: [id number] Arrival date & time: 04/30/21  1717      History   Chief Complaint Chief Complaint  Patient presents with   Insect Bite   Cellulitis    HPI Dennis Cohen is a 55 y.o. male.   Patient presents with left lower leg pain and swelling that he noticed today.  Denies any known insect bites or injuries.  Denies any fever, chills, body aches, any purulent drainage from leg.  Has been using hydrocortisone cream to help with discomfort with no relief.  Pain is mainly present when ambulating.  Denies any numbness or tingling to left lower leg.    Past Medical History:  Diagnosis Date   Heartburn    Left hydrocele    Wears glasses     Patient Active Problem List   Diagnosis Date Noted   Status post arthroscopy of left knee 04/10/2020   Acute medial meniscus tear, left, subsequent encounter 01/03/2020   Anxiety state 06/20/2016   Heartburn    Bilateral inguinal hernia s/p lap repair w mesh 06/20/2016 05/27/2016   Hydrocele, left 10/11/2013    Past Surgical History:  Procedure Laterality Date   HYDROCELE EXCISION Left 10/11/2013   Procedure: LEFT HYDROCELECTOMY ADULT;  Surgeon: Claybon Jabs, MD;  Location: [facility];  Service: Urology;  Laterality: Left;   INGUINAL HERNIA REPAIR Left 09/09/2000   Dr [name]   INGUINAL HERNIA REPAIR Bilateral 06/20/2016   Procedure: LAPAROSCOPIC REPAIR OF BILATERAL INGUINAL HERNIAS;  Surgeon: Michael Boston, MD;  Location: [facility];  Service: General;  Laterality: Bilateral;   INSERTION OF MESH Bilateral 06/20/2016   Procedure: INSERTION OF MESH;  Surgeon: Michael Boston, MD;  Location: [facility];  Service: General;  Laterality: Bilateral;   KNEE ARTHROSCOPY W/ MENISCAL REPAIR Bilateral LAST ONE  DEC 2001 (RIGHT)   TYMPANOSTOMY TUBE PLACEMENT Bilateral AS CHILD       Home Medications    Prior to Admission medications   Medication Sig Start Date End Date Taking? Authorizing Provider   cetirizine (ZYRTEC) 10 MG tablet Take 1 tablet (10 mg total) by mouth daily. 04/30/21  Yes Odis Luster, FNP  doxycycline (VIBRAMYCIN) 100 MG capsule Take 1 capsule (100 mg total) by mouth 2 (two) times daily. 04/30/21  Yes Odis Luster, FNP  acetaminophen (TYLENOL) 500 MG tablet Take 500 mg by mouth every 6 (six) hours as needed.    [provider]  HYDROcodone-acetaminophen (NORCO/VICODIN) 5-325 MG tablet Take 1-2 tablets by mouth every 6 (six) hours as needed for moderate pain. 03/30/20   Mcarthur Rossetti, MD  hydrocortisone cream 1 % Apply 1 application topically 2 (two) times daily.    [provider]  hydrOXYzine (ATARAX/VISTARIL) 25 MG tablet Take 1 tablet (25 mg total) by mouth every 6 (six) hours. 02/08/20   Darr, Edison Nasuti, PA-C  ibuprofen (ADVIL,MOTRIN) 200 MG tablet Take 400 mg by mouth every 6 (six) hours as needed (for pain.).     [provider]  traMADol (ULTRAM) 50 MG tablet Take 1-2 tablets (50-100 mg total) by mouth every 6 (six) hours as needed for moderate pain or severe pain. 06/20/16   Michael Boston, MD    Family History Family History  Problem Relation Age of Onset   Hypertension Mother    High Cholesterol Father    Heart attack Father    Healthy Sister    Healthy Brother     Social History Social History  Tobacco Use   Smoking status: Never   Smokeless tobacco: Never  Substance Use Topics   Alcohol use: No   Drug use: No     Allergies   Patient has no known allergies.   Review of Systems Review of Systems Per HPI  Physical Exam Triage Vital Signs ED Triage Vitals [04/30/21 1727]  Enc Vitals Group     BP (!) 161/91     Pulse Rate 78     Resp 16     Temp 98.8 F (37.1 C)     Temp Source Oral     SpO2 96 %     Weight      Height      Head Circumference      Peak Flow      Pain Score 0     Pain Loc      Pain Edu?      Excl. in [place]?    No data found.  Updated Vital Signs BP (!) 161/91 (BP Location: Left  Arm)   Pulse 78   Temp 98.8 F (37.1 C) (Oral)   Resp 16   SpO2 96%   Visual Acuity Right Eye Distance:   Left Eye Distance:   Bilateral Distance:    Right Eye Near:   Left Eye Near:    Bilateral Near:     Physical Exam Constitutional:      Appearance: Normal appearance.  HENT:     Head: Normocephalic and atraumatic.  Eyes:     Extraocular Movements: Extraocular movements intact.     Conjunctiva/sclera: Conjunctivae normal.  Pulmonary:     Effort: Pulmonary effort is normal.  Musculoskeletal:       Feet:  Skin:    General: Skin is warm and dry.     Comments: Approximately 0.5 cm in diameter erythematous lesion very similar to insect bite present to left ankle.  Diffuse moderate nonpitting edema surrounding insect bite.  Mild erythema noted.  No other discoloration.  No purulent drainage noted.  Neurovascular intact.  Neurological:     General: No focal deficit present.     Mental Status: He is alert and oriented to person, place, and time. Mental status is at baseline.  Psychiatric:        Mood and Affect: Mood normal.        Behavior: Behavior normal.        Thought Content: Thought content normal.        Judgment: Judgment normal.     UC Treatments / Results  Labs (all labs ordered are listed, but only abnormal results are displayed) Labs Reviewed - No data to display  EKG   Radiology No results found.  Procedures Procedures (including critical care time)  Medications Ordered in UC Medications - No data to display  Initial Impression / Assessment and Plan / UC Course  I have reviewed the triage vital signs and the nursing notes.  Pertinent labs & imaging results that were available during my care of the patient were reviewed by me and considered in my medical decision making (see chart for details).     Lesion to left medial ankle is most consistent with an insect bite.  Infectious signs and symptoms present including erythema and swelling.  Will  treat with cetirizine antihistamine, cool compresses, doxycycline antibiotic to cover for infection.  Patient advised to monitor swelling and lesion very closely and to go to the hospital if any worsening signs or symptoms are present.  No  red flags at this time and patient does not appear to be in need of immediate medical attention at the hospital at this time.  Patient to follow-up if symptoms do not improve.Discussed strict return precautions. Patient verbalized understanding and is agreeable with plan.  Final Clinical Impressions(s) / UC Diagnoses   Final diagnoses:  Localized swelling of left lower leg  Insect bite of left ankle, initial encounter  Cellulitis of leg, left  Left leg pain     Discharge Instructions      You have been prescribed cetirizine antihistamine and doxycycline antibiotic to treat your left lower leg swelling and infection.  Please also use cool compresses to help with comfort and decreasing inflammation.  Please do not take hydroxyzine and cetirizine at the same time as this may cause drowsiness.     ED Prescriptions     Medication Sig Dispense Auth. Provider   doxycycline (VIBRAMYCIN) 100 MG capsule Take 1 capsule (100 mg total) by mouth 2 (two) times daily. 20 capsule Odis Luster, FNP   cetirizine (ZYRTEC) 10 MG tablet Take 1 tablet (10 mg total) by mouth daily. 30 tablet Odis Luster, FNP      PDMP not reviewed this encounter.   Odis Luster, [place] 04/30/21 [phone]

## 2021-10-19 ENCOUNTER — Ambulatory Visit
Admission: EM | Admit: 2021-10-19 | Discharge: 2021-10-19 | Disposition: A | Discharge status: Home / Self Care | Payer: BC Managed Care – PPO | Attending: Internal Medicine | Admitting: Internal Medicine

## 2021-10-19 ENCOUNTER — Other Ambulatory Visit: Payer: Self-pay

## 2021-10-19 DIAGNOSIS — J069 Acute upper respiratory infection, unspecified: Secondary | ICD-10-CM | POA: Diagnosis not present

## 2021-10-19 DIAGNOSIS — R051 Acute cough: Secondary | ICD-10-CM

## 2021-10-19 MED ORDER — AZITHROMYCIN 500 MG PO TABS: ORAL_TABLET | ORAL | 0 refills | Status: AC

## 2021-10-19 MED ORDER — PREDNISONE 20 MG PO TABS: 40.0000 mg | ORAL_TABLET | Freq: Every day | ORAL | 0 refills | Status: AC

## 2021-10-19 MED ORDER — BENZONATATE 100 MG PO CAPS: 100.0000 mg | ORAL_CAPSULE | Freq: Three times a day (TID) | ORAL | 0 refills | Status: AC | PRN

## 2021-10-19 NOTE — ED Triage Notes (Signed)
Pt c/o cough, nasal congestion,   Denies sore throat, ear ache, headache   Onset ~ 7-8 days ago

## 2021-10-19 NOTE — Discharge Instructions (Signed)
You have been prescribed 3 medications to help alleviate symptoms.  Please follow-up if symptoms persist or worsen.  Please monitor blood pressure at home and follow-up with primary care doctor if it remains elevated.

## 2021-10-19 NOTE — ED Provider Notes (Signed)
[facility]    CSN: [id number] Arrival date & time: 10/19/21  1410      History   Chief Complaint Chief Complaint  Patient presents with   Cough    HPI Dennis Cohen is a 56 y.o. male.   Patient presents with 9-day history of nasal congestion and cough.  Denies any known fevers or sick contacts.  Denies chest pain, shortness of breath, sore throat, ear pain, nausea, vomiting, diarrhea, abdominal pain.  Denies history of asthma or COPD and patient is not a smoker.  Patient has been taking Mucinex DM for symptoms with minimal improvement.   Cough  Past Medical History:  Diagnosis Date   Heartburn    Left hydrocele    Wears glasses     Patient Active Problem List   Diagnosis Date Noted   Status post arthroscopy of left knee 04/10/2020   Acute medial meniscus tear, left, subsequent encounter 01/03/2020   Anxiety state 06/20/2016   Heartburn    Bilateral inguinal hernia s/p lap repair w mesh 06/20/2016 05/27/2016   Hydrocele, left 10/11/2013    Past Surgical History:  Procedure Laterality Date   HYDROCELE EXCISION Left 10/11/2013   Procedure: LEFT HYDROCELECTOMY ADULT;  Surgeon: Claybon Jabs, MD;  Location: [facility];  Service: Urology;  Laterality: Left;   INGUINAL HERNIA REPAIR Left 09/09/2000   Dr [name]   INGUINAL HERNIA REPAIR Bilateral 06/20/2016   Procedure: LAPAROSCOPIC REPAIR OF BILATERAL INGUINAL HERNIAS;  Surgeon: Michael Boston, MD;  Location: [facility];  Service: General;  Laterality: Bilateral;   INSERTION OF MESH Bilateral 06/20/2016   Procedure: INSERTION OF MESH;  Surgeon: Michael Boston, MD;  Location: [facility];  Service: General;  Laterality: Bilateral;   KNEE ARTHROSCOPY W/ MENISCAL REPAIR Bilateral LAST ONE  DEC 2001 (RIGHT)   TYMPANOSTOMY TUBE PLACEMENT Bilateral AS CHILD       Home Medications    Prior to Admission medications   Medication Sig Start Date End Date Taking? Authorizing Provider  azithromycin  (ZITHROMAX) 500 MG tablet Take 1 tablet (500 mg total) by mouth daily for 1 day, THEN 0.5 tablets (250 mg total) daily for 4 days. 10/19/21 10/24/21 Yes Lamoyne Palencia, Michele Rockers, FNP  benzonatate (TESSALON) 100 MG capsule Take 1 capsule (100 mg total) by mouth every 8 (eight) hours as needed for cough. 10/19/21  Yes Narvel Kozub, Hildred Alamin E, FNP  predniSONE (DELTASONE) 20 MG tablet Take 2 tablets (40 mg total) by mouth daily for 5 days. 10/19/21 10/24/21 Yes Jaylanie Boschee, Michele Rockers, FNP  acetaminophen (TYLENOL) 500 MG tablet Take 500 mg by mouth every 6 (six) hours as needed.    [provider]  cetirizine (ZYRTEC) 10 MG tablet Take 1 tablet (10 mg total) by mouth daily. 04/30/21   Teodora Medici, FNP  doxycycline (VIBRAMYCIN) 100 MG capsule Take 1 capsule (100 mg total) by mouth 2 (two) times daily. 04/30/21   Teodora Medici, FNP  HYDROcodone-acetaminophen (NORCO/VICODIN) 5-325 MG tablet Take 1-2 tablets by mouth every 6 (six) hours as needed for moderate pain. 03/30/20   Mcarthur Rossetti, MD  hydrocortisone cream 1 % Apply 1 application topically 2 (two) times daily.    [provider]  hydrOXYzine (ATARAX/VISTARIL) 25 MG tablet Take 1 tablet (25 mg total) by mouth every 6 (six) hours. 02/08/20   Darr, Edison Nasuti, PA-C  ibuprofen (ADVIL,MOTRIN) 200 MG tablet Take 400 mg by mouth every 6 (six) hours as needed (for pain.).     [provider]  traMADol (ULTRAM) 50 MG tablet Take 1-2 tablets (50-100 mg total) by mouth every 6 (six) hours as needed for moderate pain or severe pain. 06/20/16   Michael Boston, MD    Family History Family History  Problem Relation Age of Onset   Hypertension Mother    High Cholesterol Father    Heart attack Father    Healthy Sister    Healthy Brother     Social History Social History   Tobacco Use   Smoking status: Never   Smokeless tobacco: Never  Substance Use Topics   Alcohol use: No   Drug use: No     Allergies   Patient has no known allergies.   Review of  Systems Review of Systems Per HPI  Physical Exam Triage Vital Signs ED Triage Vitals [10/19/21 1422]  Enc Vitals Group     BP (!) 171/93     Pulse Rate (!) 107     Resp 18     Temp 98.1 F (36.7 C)     Temp Source Oral     SpO2 96 %     Weight      Height      Head Circumference      Peak Flow      Pain Score 0     Pain Loc      Pain Edu?      Excl. in [place]?    No data found.  Updated Vital Signs BP (!) 151/89    Pulse (!) 107    Temp 98.1 F (36.7 C) (Oral)    Resp 18    SpO2 96%   Visual Acuity Right Eye Distance:   Left Eye Distance:   Bilateral Distance:    Right Eye Near:   Left Eye Near:    Bilateral Near:     Physical Exam Constitutional:      General: He is not in acute distress.    Appearance: Normal appearance. He is not toxic-appearing or diaphoretic.  HENT:     Head: Normocephalic and atraumatic.     Right Ear: Tympanic membrane and ear canal normal.     Left Ear: Tympanic membrane and ear canal normal.     Nose: Congestion present.     Mouth/Throat:     Mouth: Mucous membranes are moist.     Pharynx: No posterior oropharyngeal erythema.  Eyes:     Extraocular Movements: Extraocular movements intact.     Conjunctiva/sclera: Conjunctivae normal.     Pupils: Pupils are equal, round, and reactive to light.  Cardiovascular:     Rate and Rhythm: Normal rate and regular rhythm.     Pulses: Normal pulses.     Heart sounds: Normal heart sounds.  Pulmonary:     Effort: Pulmonary effort is normal. No respiratory distress.     Breath sounds: Normal breath sounds. No stridor. No wheezing, rhonchi or rales.  Abdominal:     General: Abdomen is flat. Bowel sounds are normal.     Palpations: Abdomen is soft.  Musculoskeletal:        General: Normal range of motion.     Cervical back: Normal range of motion.  Skin:    General: Skin is warm and dry.  Neurological:     General: No focal deficit present.     Mental Status: He is alert and oriented to  person, place, and time. Mental status is at baseline.  Psychiatric:  Mood and Affect: Mood normal.        Behavior: Behavior normal.     UC Treatments / Results  Labs (all labs ordered are listed, but only abnormal results are displayed) Labs Reviewed - No data to display  EKG   Radiology No results found.  Procedures Procedures (including critical care time)  Medications Ordered in UC Medications - No data to display  Initial Impression / Assessment and Plan / UC Course  I have reviewed the triage vital signs and the nursing notes.  Pertinent labs & imaging results that were available during my care of the patient were reviewed by me and considered in my medical decision making (see chart for details).     Patient's symptoms are most likely viral in etiology.  Although will cover with azithromycin for atypicals.  Patient likely has a viral bronchitis.  Will treat with benzonatate and prednisone as well given duration of symptoms.  Discussed return precautions.  Patient verbalized understanding and was agreeable with plan. Final Clinical Impressions(s) / UC Diagnoses   Final diagnoses:  Acute upper respiratory infection  Acute cough     Discharge Instructions      You have been prescribed 3 medications to help alleviate symptoms.  Please follow-up if symptoms persist or worsen.  Please monitor blood pressure at home and follow-up with primary care doctor if it remains elevated.    ED Prescriptions     Medication Sig Dispense Auth. Provider   azithromycin (ZITHROMAX) 500 MG tablet Take 1 tablet (500 mg total) by mouth daily for 1 day, THEN 0.5 tablets (250 mg total) daily for 4 days. 3 tablet [place], [place] E, [place]   predniSONE (DELTASONE) 20 MG tablet Take 2 tablets (40 mg total) by mouth daily for 5 days. 10 tablet [place], [place] E, [place]   benzonatate (TESSALON) 100 MG capsule Take 1 capsule (100 mg total) by mouth every 8 (eight) hours as needed for cough. 21  capsule [place], Michele Rockers, [place]      PDMP not reviewed this encounter.   Teodora Medici, [place] 10/19/21 1440

## 2022-06-03 ENCOUNTER — Ambulatory Visit: Payer: BC Managed Care – PPO | Admitting: Physician Assistant

## 2022-06-03 ENCOUNTER — Encounter: Payer: Self-pay | Admitting: Physician Assistant

## 2022-06-03 DIAGNOSIS — M25461 Effusion, right knee: Secondary | ICD-10-CM | POA: Diagnosis not present

## 2022-06-03 DIAGNOSIS — M25561 Pain in right knee: Secondary | ICD-10-CM

## 2022-06-03 DIAGNOSIS — S83231D Complex tear of medial meniscus, current injury, right knee, subsequent encounter: Secondary | ICD-10-CM

## 2022-06-03 MED ORDER — LIDOCAINE HCL 1 % IJ SOLN
3.0000 mL | INTRAMUSCULAR | Status: AC | PRN
  Administered 2022-06-03: 3 mL

## 2022-06-03 MED ORDER — METHYLPREDNISOLONE ACETATE 40 MG/ML IJ SUSP
40.0000 mg | INTRAMUSCULAR | Status: AC | PRN
  Administered 2022-06-03: 40 mg via INTRA_ARTICULAR

## 2022-06-03 NOTE — Progress Notes (Signed)
   Procedure Note  Patient: Dennis Cohen             Date of Birth: 1966/04/07           MRN: [id number]             Visit Date: 06/03/2022 HPI: Dennis Cohen comes in today with right knee pain.  Dennis Cohen is is been seen since June 2022.  Dennis Cohen had undergone an MRI of his right knee at that time which showed a complex tear of the medial meniscus of the right knee.  Also significant cartilage thinning medial compartment.  At that time Dennis Cohen underwent an aspiration and injection of his knee.  Dennis Cohen states that Dennis Cohen done well until last Tuesday when it.  Had pain in the knee Dennis Cohen had no injury.  Pain is mostly along the medial aspect of the knee in the patellofemoral region.  Notes mechanical symptoms positive for giving way and painful popping but no catching locking.  Review of systems: Negative for fevers chills.  Physical exam: General well-developed well-nourished pleasant male in no acute distress. Right knee positive effusion no abnormal warmth.  Good range of motion.  Positive McMurray's negative for laxity with valgus varus stressing. Procedures: Visit Diagnoses:  1. Complex tear of medial meniscus of right knee as current injury, subsequent encounter   2. Effusion, right knee   3. Acute pain of right knee     Large Joint Inj on 06/03/2022 4:31 PM Indications: pain Details: 22 G 1.5 in needle, superolateral approach  Arthrogram: No  Medications: 3 mL lidocaine 1 %; 40 mg methylPREDNISolone acetate 40 MG/ML Aspirate: 23 mL yellow Outcome: tolerated well, no immediate complications Procedure, treatment alternatives, risks and benefits explained, specific risks discussed. Consent was given by the patient. Immediately prior to procedure a time out was called to verify the correct patient, procedure, equipment, support staff and site/side marked as required. Patient was prepped and draped in the usual sterile fashion.     Plan: We will see him back in 2 weeks to see what type of response Dennis Cohen had to the  injection and aspiration.  Ace bandage was applied to the needle remove this this evening before retiring to bed.  Questions were encouraged and answered

## 2022-06-17 ENCOUNTER — Encounter: Payer: Self-pay | Admitting: Physician Assistant

## 2022-06-17 ENCOUNTER — Ambulatory Visit: Payer: BC Managed Care – PPO | Admitting: Physician Assistant

## 2022-06-17 DIAGNOSIS — M25561 Pain in right knee: Secondary | ICD-10-CM

## 2022-06-17 NOTE — Progress Notes (Signed)
HPI: Dennis Cohen returns today status post aspiration injection right knee on 06/03/2022.  He states overall the knee is doing well.  He has no mechanical symptoms involving the right knee.  Is had no recurrent effusion.  Still has pain occasionally medial aspect of the knee.  Review of systems: See HPI otherwise negative  Physical exam: General well-developed well-nourished male who ambulates without any assistive device. Right knee: No effusion abnormal warmth erythema.  Tenderness along medial joint line.  No instability valgus varus stressing.  Impression: Right knee pain acute  Plan: He will follow-up with [place] as needed develops any mechanical symptoms or recurrent effusion.  Questions were encouraged and answered at length

## 2023-03-11 ENCOUNTER — Ambulatory Visit
Admission: EM | Admit: 2023-03-11 | Discharge: 2023-03-11 | Disposition: A | Discharge status: Home / Self Care | Payer: BC Managed Care – PPO | Attending: Family Medicine | Admitting: Family Medicine

## 2023-03-11 DIAGNOSIS — B9689 Other specified bacterial agents as the cause of diseases classified elsewhere: Secondary | ICD-10-CM

## 2023-03-11 DIAGNOSIS — L089 Local infection of the skin and subcutaneous tissue, unspecified: Secondary | ICD-10-CM | POA: Diagnosis not present

## 2023-03-11 DIAGNOSIS — W57XXXA Bitten or stung by nonvenomous insect and other nonvenomous arthropods, initial encounter: Secondary | ICD-10-CM | POA: Diagnosis not present

## 2023-03-11 MED ORDER — DOXYCYCLINE HYCLATE 100 MG PO CAPS: 100.0000 mg | ORAL_CAPSULE | Freq: Two times a day (BID) | ORAL | 0 refills | Status: AC

## 2023-03-11 MED ORDER — PREDNISONE 20 MG PO TABS: 40.0000 mg | ORAL_TABLET | Freq: Every day | ORAL | 0 refills | Status: AC

## 2023-03-11 NOTE — Discharge Instructions (Signed)
Start prednisone 40 mg daily x 5.  Doxycycline 100 mg twice daily x 7 days.  Elevate your foot to reduce swelling and apply ice.  Keep wound covered with a bandage until it is no longer draining.  If infection appears to worsen return for evaluation.

## 2023-03-11 NOTE — ED Provider Notes (Signed)
[facility]    CSN: [id number] Arrival date & time: 03/11/23  1322      History   Chief Complaint Chief Complaint  Patient presents with   Insect Bite    Left Leg    HPI Dennis Cohen is a 57 y.o. male.   HPI Patient presents for evaluation of left ankle wound, which is weeping a purulent discharge. He has not had fever.  Recalls experiencing itching after the and outside however did not see any specific insect bite. Noticed that his ankle has now began to swell and become red. He endorses both itching and burning at the site of the wound. He is concerned for possible infection.    Past Medical History:  Diagnosis Date   Heartburn    Left hydrocele    Wears glasses     Patient Active Problem List   Diagnosis Date Noted   Status post arthroscopy of left knee 04/10/2020   Acute medial meniscus tear, left, subsequent encounter 01/03/2020   Anxiety state 06/20/2016   Heartburn    Bilateral inguinal hernia s/p lap repair w mesh 06/20/2016 05/27/2016   Hydrocele, left 10/11/2013    Past Surgical History:  Procedure Laterality Date   HYDROCELE EXCISION Left 10/11/2013   Procedure: LEFT HYDROCELECTOMY ADULT;  Surgeon: Garnett Farm, MD;  Location: [facility];  Service: Urology;  Laterality: Left;   INGUINAL HERNIA REPAIR Left 09/09/2000   Dr Maryagnes Amos   INGUINAL HERNIA REPAIR Bilateral 06/20/2016   Procedure: LAPAROSCOPIC REPAIR OF BILATERAL INGUINAL HERNIAS;  Surgeon: Karie Soda, MD;  Location: [facility];  Service: General;  Laterality: Bilateral;   INSERTION OF MESH Bilateral 06/20/2016   Procedure: INSERTION OF MESH;  Surgeon: Karie Soda, MD;  Location: [facility];  Service: General;  Laterality: Bilateral;   KNEE ARTHROSCOPY W/ MENISCAL REPAIR Bilateral LAST ONE  DEC 2001 (RIGHT)   TYMPANOSTOMY TUBE PLACEMENT Bilateral AS CHILD       Home Medications    Prior to Admission medications   Medication Sig Start Date End Date Taking? Authorizing  Provider  doxycycline (VIBRAMYCIN) 100 MG capsule Take 1 capsule (100 mg total) by mouth 2 (two) times daily for 7 days. 03/11/23 03/18/23 Yes Bing Neighbors, NP  predniSONE (DELTASONE) 20 MG tablet Take 2 tablets (40 mg total) by mouth daily with breakfast. 03/11/23  Yes Bing Neighbors, NP  acetaminophen (TYLENOL) 500 MG tablet Take 500 mg by mouth every 6 (six) hours as needed.    [provider]  benzonatate (TESSALON) 100 MG capsule Take 1 capsule (100 mg total) by mouth every 8 (eight) hours as needed for cough. 10/19/21   Gustavus Bryant, FNP  cetirizine (ZYRTEC) 10 MG tablet Take 1 tablet (10 mg total) by mouth daily. 04/30/21   Gustavus Bryant, FNP  HYDROcodone-acetaminophen (NORCO/VICODIN) 5-325 MG tablet Take 1-2 tablets by mouth every 6 (six) hours as needed for moderate pain. 03/30/20   Kathryne Hitch, MD  hydrocortisone cream 1 % Apply 1 application topically 2 (two) times daily.    [provider]  hydrOXYzine (ATARAX/VISTARIL) 25 MG tablet Take 1 tablet (25 mg total) by mouth every 6 (six) hours. 02/08/20   Darr, Gerilyn Pilgrim, PA-C  ibuprofen (ADVIL,MOTRIN) 200 MG tablet Take 400 mg by mouth every 6 (six) hours as needed (for pain.).     [provider]  traMADol (ULTRAM) 50 MG tablet Take 1-2 tablets (50-100 mg total) by mouth every 6 (six) hours as  needed for moderate pain or severe pain. 06/20/16   Karie Soda, MD    Family History Family History  Problem Relation Age of Onset   Hypertension Mother    High Cholesterol Father    Heart attack Father    Healthy Sister    Healthy Brother     Social History Social History   Tobacco Use   Smoking status: Never   Smokeless tobacco: Never  Vaping Use   Vaping status: Never Used  Substance Use Topics   Alcohol use: No   Drug use: No     Allergies   Patient has no known allergies.   Review of Systems Review of Systems Pertinent negatives listed in HPI  Physical Exam Triage Vital  Signs ED Triage Vitals  Encounter Vitals Group     BP 03/11/23 1429 (!) 131/92     Systolic BP Percentile --      Diastolic BP Percentile --      Pulse Rate 03/11/23 1429 96     Resp 03/11/23 1429 18     Temp 03/11/23 1429 98.2 F (36.8 C)     Temp Source 03/11/23 1429 Oral     SpO2 03/11/23 1429 97 %     Weight 03/11/23 1428 184 lb (83.5 kg)     Height 03/11/23 1428 5\' 4"  (1.626 m)     Head Circumference --      Peak Flow --      Pain Score 03/11/23 1427 5     Pain Loc --      Pain Education --      Exclude from Growth Chart --    No data found.  Updated Vital Signs BP (!) 133/92 (BP Location: Right Arm)   Pulse 96   Temp 98.2 F (36.8 C) (Oral)   Resp 18   Ht 5\' 4"  (1.626 m)   Wt 184 lb (83.5 kg)   SpO2 97%   BMI 31.58 kg/m   Visual Acuity Right Eye Distance:   Left Eye Distance:   Bilateral Distance:    Right Eye Near:   Left Eye Near:    Bilateral Near:     Physical Exam Vitals reviewed.  Constitutional:      Appearance: Normal appearance.  HENT:     Head: Normocephalic and atraumatic.  Cardiovascular:     Rate and Rhythm: Normal rate and regular rhythm.  Pulmonary:     Effort: Pulmonary effort is normal.     Breath sounds: Normal breath sounds.  Musculoskeletal:       Legs:  Neurological:     General: No focal deficit present.     Mental Status: He is alert.      UC Treatments / Results  Labs (all labs ordered are listed, but only abnormal results are displayed) Labs Reviewed - No data to display  EKG   Radiology No results found.  Procedures Procedures (including critical care time)  Medications Ordered in UC Medications - No data to display  Initial Impression / Assessment and Plan / UC Course  I have reviewed the triage vital signs and the nursing notes.  Pertinent labs & imaging results that were available during my care of the patient were reviewed by me and considered in my medical decision making (see chart for  details).   Bite or insect sting with infection, treatment per discharge medication orders.  Patient advised to continue to monitor site for improvement however if symptoms appear to be worsen return for  evaluation.  Encouraged to elevate extremity and apply ice to ankle to reduce swelling.  Patient verbalized understanding and agreement with plan. Final Clinical Impressions(s) / UC Diagnoses   Final diagnoses:  Bite or sting by insect with infection     Discharge Instructions      Start prednisone 40 mg daily x 5.  Doxycycline 100 mg twice daily x 7 days.  Elevate your foot to reduce swelling and apply ice.  Keep wound covered with a bandage until it is no longer draining.  If infection appears to worsen return for evaluation.     ED Prescriptions     Medication Sig Dispense Auth. Provider   doxycycline (VIBRAMYCIN) 100 MG capsule Take 1 capsule (100 mg total) by mouth 2 (two) times daily for 7 days. 14 capsule Bing Neighbors, NP   predniSONE (DELTASONE) 20 MG tablet Take 2 tablets (40 mg total) by mouth daily with breakfast. 10 tablet Bing Neighbors, NP      PDMP not reviewed this encounter.   Bing Neighbors, NP 03/12/23 1023

## 2023-03-11 NOTE — ED Triage Notes (Signed)
"  Insect bite/sting on inside of left ankle". "Probably a spider bite". A lot of "redness and swelling with some drainage". No fever.
